# Patient Record
Sex: Male | Born: 1960 | Race: White | Hispanic: No | Smoking: Former smoker
Health system: Southern US, Community
[De-identification: ages and names within clinical notes are randomized; demographics above are authoritative.]

## PROBLEM LIST (undated history)

## (undated) DIAGNOSIS — I10 Essential (primary) hypertension: Secondary | ICD-10-CM

## (undated) DIAGNOSIS — E119 Type 2 diabetes mellitus without complications: Secondary | ICD-10-CM

## (undated) DIAGNOSIS — N200 Calculus of kidney: Secondary | ICD-10-CM

## (undated) DIAGNOSIS — T7840XA Allergy, unspecified, initial encounter: Secondary | ICD-10-CM

## (undated) DIAGNOSIS — M1990 Unspecified osteoarthritis, unspecified site: Secondary | ICD-10-CM

## (undated) DIAGNOSIS — H359 Unspecified retinal disorder: Secondary | ICD-10-CM

## (undated) HISTORY — DX: Calculus of kidney: N20.0

## (undated) HISTORY — DX: Allergy, unspecified, initial encounter: T78.40XA

## (undated) HISTORY — PX: OTHER SURGICAL HISTORY: SHX169

## (undated) HISTORY — DX: Essential (primary) hypertension: I10

## (undated) HISTORY — DX: Unspecified retinal disorder: H35.9

## (undated) HISTORY — DX: Unspecified osteoarthritis, unspecified site: M19.90

## (undated) HISTORY — DX: Type 2 diabetes mellitus without complications: E11.9

---

## 2004-09-16 ENCOUNTER — Ambulatory Visit: Payer: Self-pay | Admitting: Internal Medicine

## 2006-03-09 ENCOUNTER — Ambulatory Visit: Payer: Self-pay | Admitting: Internal Medicine

## 2006-11-23 ENCOUNTER — Ambulatory Visit: Payer: Self-pay | Admitting: Internal Medicine

## 2006-11-28 ENCOUNTER — Encounter: Admission: RE | Admit: 2006-11-28 | Discharge: 2006-11-28 | Payer: Self-pay | Admitting: Internal Medicine

## 2006-12-01 DIAGNOSIS — H9190 Unspecified hearing loss, unspecified ear: Secondary | ICD-10-CM | POA: Insufficient documentation

## 2006-12-01 DIAGNOSIS — I10 Essential (primary) hypertension: Secondary | ICD-10-CM

## 2007-07-25 ENCOUNTER — Telehealth: Payer: Self-pay | Admitting: Internal Medicine

## 2007-07-25 ENCOUNTER — Emergency Department (HOSPITAL_COMMUNITY): Admission: EM | Admit: 2007-07-25 | Discharge: 2007-07-25 | Payer: Self-pay | Admitting: Emergency Medicine

## 2008-09-30 ENCOUNTER — Ambulatory Visit: Payer: Self-pay | Admitting: Internal Medicine

## 2008-10-20 IMAGING — CR DG TIBIA/FIBULA 2V*R*
2 series · 2 of 2 positions shown · non-contrast
Comparison: none

CLINICAL DATA: Hit shin six months ago with pain.  

 RIGHT TIBIA FIBULA - 2 VIEW:

[view not recorded (1 of 2)]
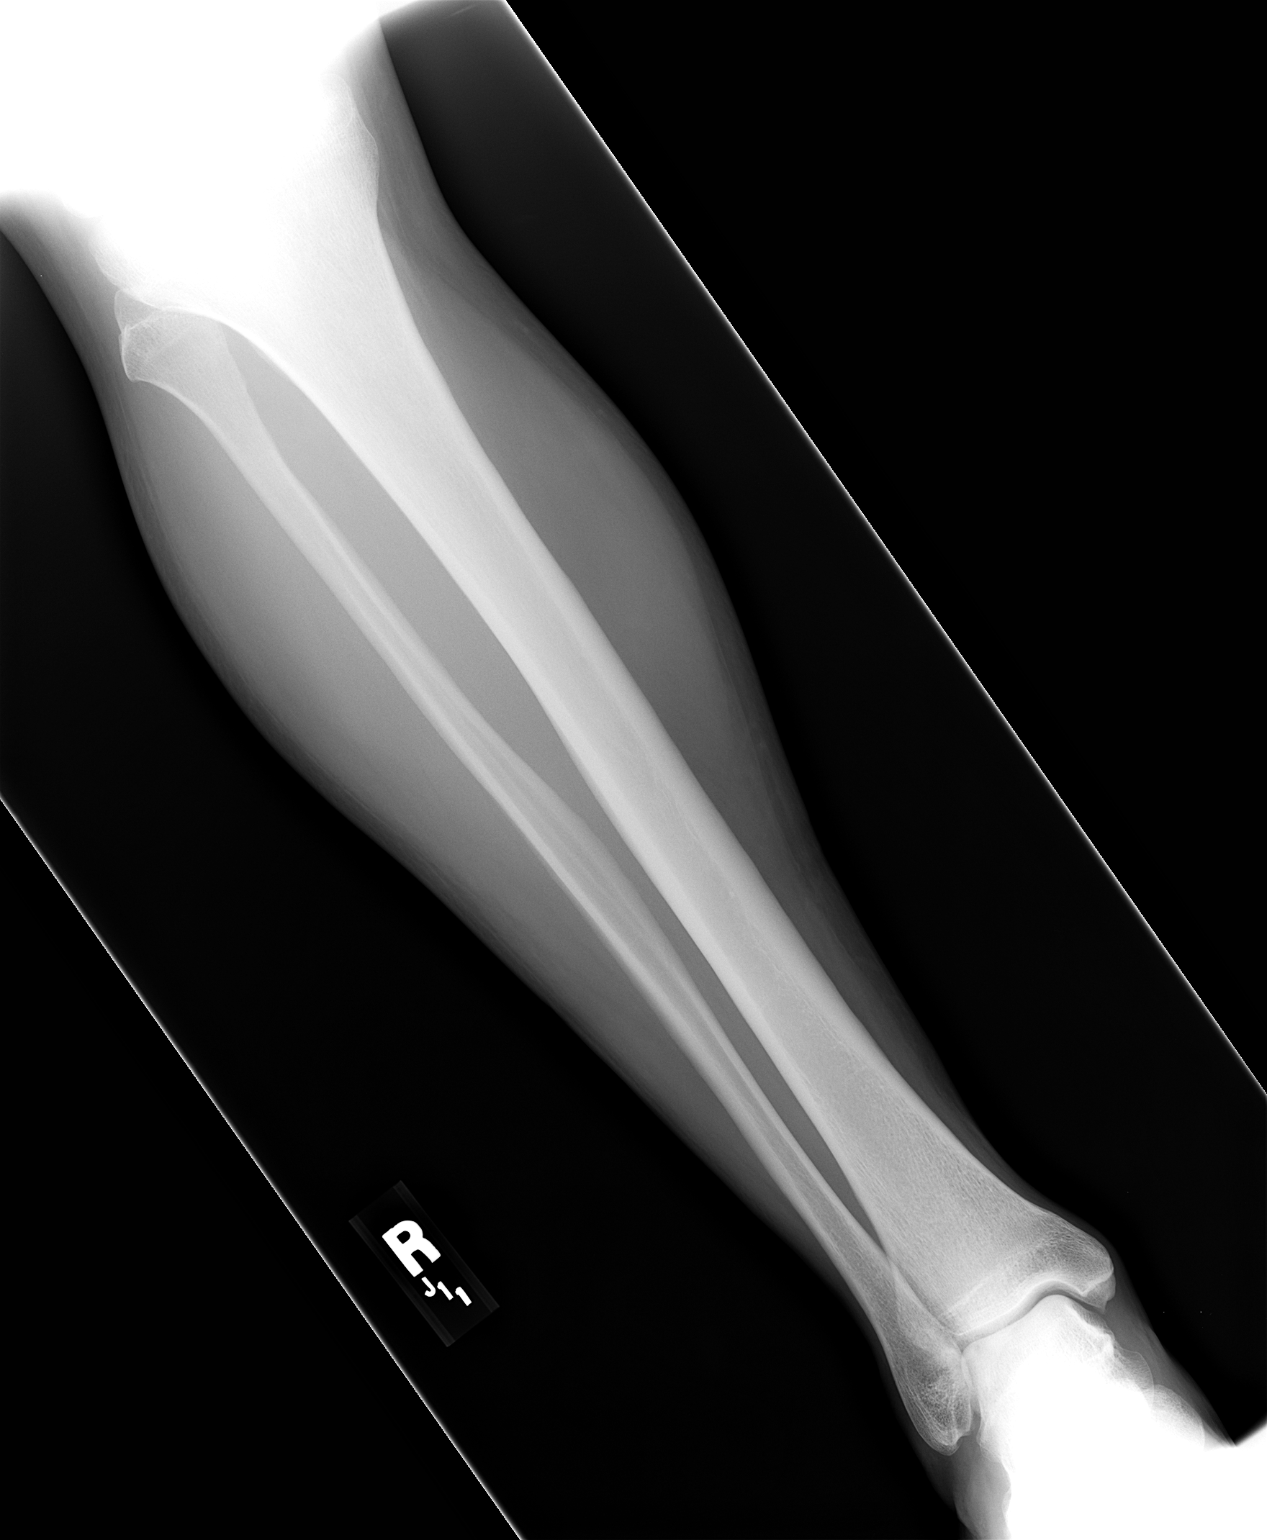

[view not recorded (2 of 2)]
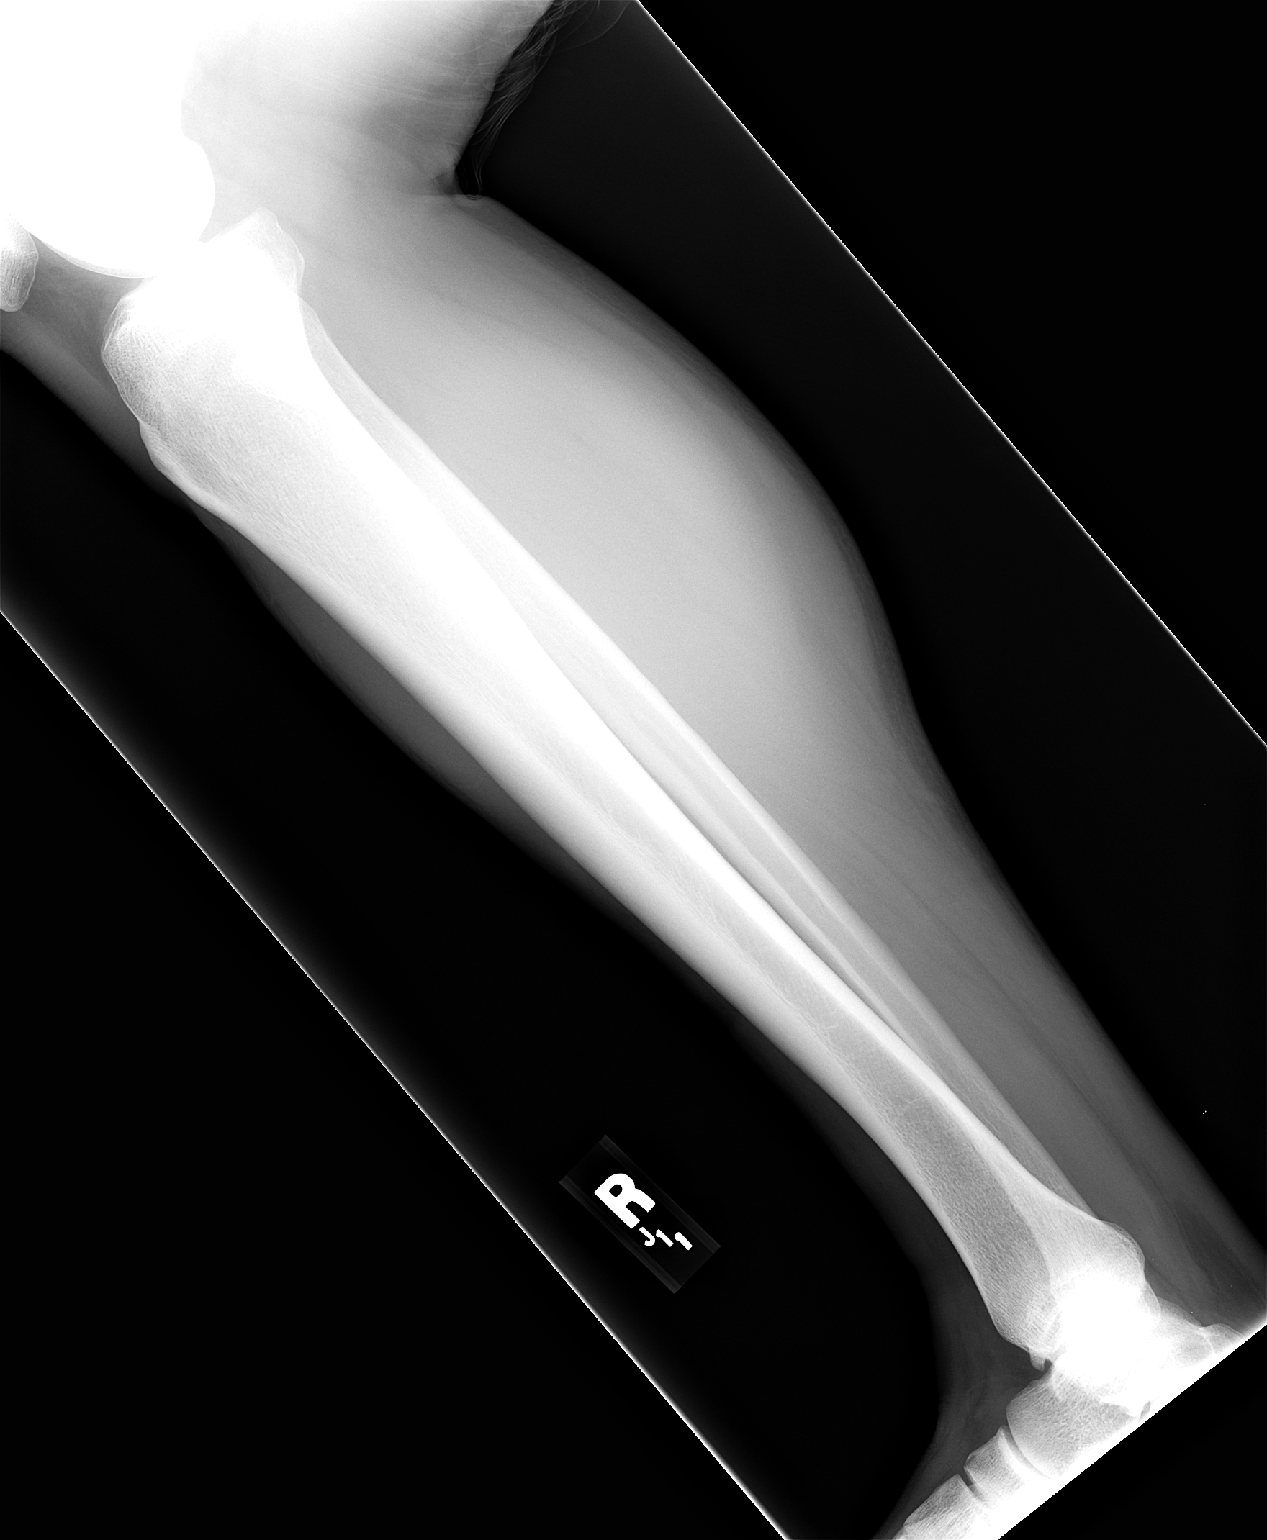

[2 of 2 positions shown; findings below may reference images not displayed]

FINDINGS: Two views of the right tibia and fibula were obtained.  No acute bony abnormality is seen.  Alignment is normal.
IMPRESSION: Negative.

## 2009-01-14 ENCOUNTER — Encounter (INDEPENDENT_AMBULATORY_CARE_PROVIDER_SITE_OTHER): Payer: Self-pay | Admitting: *Deleted

## 2009-01-14 ENCOUNTER — Ambulatory Visit: Payer: Self-pay | Admitting: Internal Medicine

## 2009-01-16 ENCOUNTER — Telehealth: Payer: Self-pay | Admitting: Internal Medicine

## 2009-07-07 ENCOUNTER — Ambulatory Visit: Payer: Self-pay | Admitting: Diagnostic Radiology

## 2009-07-07 ENCOUNTER — Ambulatory Visit (HOSPITAL_BASED_OUTPATIENT_CLINIC_OR_DEPARTMENT_OTHER): Admission: RE | Admit: 2009-07-07 | Discharge: 2009-07-07 | Payer: Self-pay | Admitting: Internal Medicine

## 2009-07-07 ENCOUNTER — Ambulatory Visit: Payer: Self-pay | Admitting: Family

## 2009-07-07 ENCOUNTER — Telehealth: Payer: Self-pay | Admitting: Family

## 2009-07-07 DIAGNOSIS — M549 Dorsalgia, unspecified: Secondary | ICD-10-CM | POA: Insufficient documentation

## 2009-07-07 DIAGNOSIS — N200 Calculus of kidney: Secondary | ICD-10-CM | POA: Insufficient documentation

## 2009-07-07 LAB — CONVERTED CEMR LAB
Glucose, Urine, Semiquant: NEGATIVE
Protein, U semiquant: NEGATIVE
Urobilinogen, UA: 0.2
WBC Urine, dipstick: NEGATIVE
pH: 6.5

## 2009-07-08 ENCOUNTER — Encounter: Payer: Self-pay | Admitting: Family

## 2009-07-09 ENCOUNTER — Telehealth: Payer: Self-pay | Admitting: Family

## 2009-07-14 ENCOUNTER — Encounter: Payer: Self-pay | Admitting: Family

## 2009-07-14 LAB — CONVERTED CEMR LAB
CO2: 26 meq/L (ref 19–32)
Calcium: 10 mg/dL (ref 8.4–10.5)
Chloride: 102 meq/L (ref 96–112)
Glucose, Bld: 93 mg/dL (ref 70–99)
Sodium: 141 meq/L (ref 135–145)

## 2009-07-15 ENCOUNTER — Encounter: Payer: Self-pay | Admitting: Family

## 2009-09-12 ENCOUNTER — Encounter: Payer: Self-pay | Admitting: Internal Medicine

## 2009-09-19 ENCOUNTER — Encounter (INDEPENDENT_AMBULATORY_CARE_PROVIDER_SITE_OTHER): Payer: Self-pay | Admitting: *Deleted

## 2009-09-23 ENCOUNTER — Telehealth (INDEPENDENT_AMBULATORY_CARE_PROVIDER_SITE_OTHER): Payer: Self-pay | Admitting: *Deleted

## 2009-09-26 ENCOUNTER — Ambulatory Visit: Payer: Self-pay | Admitting: Internal Medicine

## 2009-10-08 ENCOUNTER — Encounter: Payer: Self-pay | Admitting: Family

## 2009-10-08 ENCOUNTER — Encounter: Payer: Self-pay | Admitting: Internal Medicine

## 2009-10-10 ENCOUNTER — Encounter: Payer: Self-pay | Admitting: Internal Medicine

## 2009-10-14 ENCOUNTER — Ambulatory Visit (HOSPITAL_BASED_OUTPATIENT_CLINIC_OR_DEPARTMENT_OTHER): Admission: RE | Admit: 2009-10-14 | Discharge: 2009-10-14 | Payer: Self-pay | Admitting: Urology

## 2009-10-21 ENCOUNTER — Encounter: Payer: Self-pay | Admitting: Family

## 2009-10-21 ENCOUNTER — Telehealth (INDEPENDENT_AMBULATORY_CARE_PROVIDER_SITE_OTHER): Payer: Self-pay | Admitting: *Deleted

## 2009-10-28 ENCOUNTER — Ambulatory Visit: Payer: Self-pay | Admitting: Internal Medicine

## 2009-11-12 ENCOUNTER — Ambulatory Visit: Payer: Self-pay | Admitting: Family Medicine

## 2009-11-17 ENCOUNTER — Telehealth: Payer: Self-pay | Admitting: Internal Medicine

## 2009-11-17 ENCOUNTER — Telehealth (INDEPENDENT_AMBULATORY_CARE_PROVIDER_SITE_OTHER): Payer: Self-pay | Admitting: *Deleted

## 2009-11-28 ENCOUNTER — Encounter: Payer: Self-pay | Admitting: Internal Medicine

## 2009-12-09 ENCOUNTER — Ambulatory Visit: Payer: Self-pay | Admitting: Internal Medicine

## 2010-01-15 ENCOUNTER — Encounter (INDEPENDENT_AMBULATORY_CARE_PROVIDER_SITE_OTHER): Payer: Self-pay | Admitting: *Deleted

## 2010-02-03 ENCOUNTER — Ambulatory Visit: Payer: Self-pay | Admitting: Internal Medicine

## 2010-02-06 LAB — CONVERTED CEMR LAB
Basophils Absolute: 0 10*3/uL (ref 0.0–0.1)
Basophils Relative: 0.1 % (ref 0.0–3.0)
CO2: 27 meq/L (ref 19–32)
Chloride: 104 meq/L (ref 96–112)
Eosinophils Absolute: 0 10*3/uL (ref 0.0–0.7)
Glucose, Bld: 86 mg/dL (ref 70–99)
HCT: 42.1 % (ref 39.0–52.0)
Hemoglobin: 14.8 g/dL (ref 13.0–17.0)
Lymphocytes Relative: 61 % — ABNORMAL HIGH (ref 12.0–46.0)
Lymphs Abs: 2.3 10*3/uL (ref 0.7–4.0)
MCHC: 35.2 g/dL (ref 30.0–36.0)
MCV: 88.7 fL (ref 78.0–100.0)
Monocytes Absolute: 0.5 10*3/uL (ref 0.1–1.0)
Neutro Abs: 0.9 10*3/uL — ABNORMAL LOW (ref 1.4–7.7)
Potassium: 4.3 meq/L (ref 3.5–5.1)
RBC: 4.75 M/uL (ref 4.22–5.81)
RDW: 13.8 % (ref 11.5–14.6)
Sodium: 140 meq/L (ref 135–145)

## 2010-08-10 ENCOUNTER — Ambulatory Visit
Admission: RE | Admit: 2010-08-10 | Discharge: 2010-08-10 | Payer: Self-pay | Source: Home / Self Care | Attending: Internal Medicine | Admitting: Internal Medicine

## 2010-08-10 ENCOUNTER — Encounter: Payer: Self-pay | Admitting: Internal Medicine

## 2010-08-10 DIAGNOSIS — S70929A Unspecified superficial injury of unspecified thigh, initial encounter: Secondary | ICD-10-CM

## 2010-08-10 DIAGNOSIS — S70919A Unspecified superficial injury of unspecified hip, initial encounter: Secondary | ICD-10-CM | POA: Insufficient documentation

## 2010-08-10 DIAGNOSIS — S90919A Unspecified superficial injury of unspecified ankle, initial encounter: Secondary | ICD-10-CM

## 2010-08-10 DIAGNOSIS — S80929A Unspecified superficial injury of unspecified lower leg, initial encounter: Secondary | ICD-10-CM

## 2010-08-11 ENCOUNTER — Telehealth: Payer: Self-pay | Admitting: Internal Medicine

## 2010-08-17 ENCOUNTER — Ambulatory Visit: Admit: 2010-08-17 | Payer: Self-pay | Admitting: Internal Medicine

## 2010-08-23 LAB — CONVERTED CEMR LAB
Alkaline Phosphatase: 75 units/L
CO2, serum: 33 mmol/L
Calcium, Ion: 9.5 mg/dL
Chloride, Serum: 101 mmol/L
Creatinine, Ser: 1 mg/dL
Glucose, Bld: 86 mg/dL
Total Protein: 7.1 g/dL

## 2010-08-25 NOTE — Letter (Signed)
Summary: [facility]   Imported By: Lanelle Bal 09/29/2009 10:46:10  _____________________________________________________________________  External Attachment:    Type:   Image     Comment:   External Document

## 2010-08-25 NOTE — Progress Notes (Signed)
Summary: needs bp check  Phone Note Outgoing Call   Summary of Call: recently started on BP medicine, needs a BP check Jose E. Paz MD  October 21, 2009 9:25 AM   Cody Fox - pls call & schedule him a nurse BP check Shary Decamp  October 21, 2009 2:13 PM     Additional Follow-up for Phone Call Additional follow up Details #2::    Patient will call back eo schedule an appt Follow-up by: Barb Merino,  October 21, 2009 2:16 PM

## 2010-08-25 NOTE — Assessment & Plan Note (Signed)
Summary: BP VISIT PER [facility]////SPH  Nurse Visit   Vital Signs:  Patient profile:   50 year old male Height:      69 inches Weight:      228 pounds BMI:     33.79 Pulse rate:   78 / minute BP sitting:   150 / 100  Vitals Entered By: Shary Decamp (November 12, 2009 11:31 AM) CC: bp check Comments Patient states he feels ok.  Had 2 cups of coffee before ov today.  Advised to check bp over next 5 days & call me monday with readings. Shary Decamp  November 12, 2009 11:32 AM    Impression & Recommendations:  Problem # 1:  HYPERTENSION (ICD-401.9) not at goal  plan : add cozaar 50mg  1 once daily nurse visit for BP check and BMP in 3 weeks  Reshawn Ostlund E. Jyla Hopf MD  November 17, 2009 2:10 PM    His updated medication list for this problem includes:    Metoprolol Succinate 50 Mg Xr24h-tab (Metoprolol succinate) .Marland Kitchen... 2  by mouth daily    Losartan Potassium 50 Mg Tabs (Losartan potassium) .Marland Kitchen... 1 daily - due bp check in 3 weeks  Orders: No Charge Patient Arrived (NCPA0) (NCPA0)  Complete Medication List: 1)  Metoprolol Succinate 50 Mg Xr24h-tab (Metoprolol succinate) .... 2  by mouth daily 2)  Losartan Potassium 50 Mg Tabs (Losartan potassium) .Marland Kitchen.. 1 daily - due bp check in 3 weeks   Current Medications (verified): 1)  Metoprolol Succinate 50 Mg Xr24h-Tab (Metoprolol Succinate) .... 2  By Mouth Daily  Allergies (verified): No Known Drug Allergies  Orders Added: 1)  No Charge Patient Arrived (NCPA0) [NCPA0] Prescriptions: LOSARTAN POTASSIUM 50 MG TABS (LOSARTAN POTASSIUM) 1 daily - DUE BP CHECK IN 3 WEEKS  #30 x 1   Entered by:   Shary Decamp   Authorized by:   Nolon Rod. Harmonii Karle MD   Signed by:   Shary Decamp on 11/17/2009   Method used:   [facility]        [facility] [phone][facility] (retail)       [address]       [place], [place]  [address]       Ph: [phone]       Fax: [phone]   RxID:   [id number] METOPROLOL SUCCINATE 50 MG XR24H-TAB  (METOPROLOL SUCCINATE) 2  by mouth daily  #60 x 0   Entered by:   Shary Decamp   Authorized by:   Nolon Rod. Saladin Petrelli MD   Signed by:   Shary Decamp on 11/12/2009   Method used:   [facility]ically to        [facility] [phone]* (retail)       [address]       [place], [place]  [address]       Ph: [phone]       Fax: [phone]   RxID:   [id number]

## 2010-08-25 NOTE — Progress Notes (Signed)
Summary: needs ov  Phone Note Outgoing Call   Summary of Call: see eye exam report, arrange a  office visit within the next few days Jose E. Paz MD  September 23, 2009 2:57 PM   Enrique Sack -- needs ov, needs to discuss note that we received from opth [facility]  September 23, 2009 4:40 PM     Additional Follow-up for Phone Call Additional follow up Details #2::    LMTCB Follow-up by: Barb Merino,  September 24, 2009 8:43 AM

## 2010-08-25 NOTE — Letter (Signed)
Summary: [facility]  [facility]   Imported By: Lanelle Bal 11/03/2009 12:55:03  _____________________________________________________________________  External Attachment:    Type:   Image     Comment:   External Document

## 2010-08-25 NOTE — Assessment & Plan Note (Signed)
Summary: FOLLOWUP ///SPH   Vital Signs:  Patient profile:   50 year old male Height:      69 inches Weight:      228.4 pounds BMI:     33.85 Pulse rate:   80 / minute BP sitting:   150 / 96  Vitals Entered By: Shary Decamp (October 28, 2009 3:44 PM) CC: rov - bp   History of Present Illness: f/u HTN no s/e from meds   Current Medications (verified): 1)  Metoprolol Succinate 50 Mg Xr24h-Tab (Metoprolol Succinate) .... One By Mouth Daily  Allergies (verified): No Known Drug Allergies  Past History:  Past Medical History: Reviewed history from 12/01/2006 and no changes required. Hypertension  Past Surgical History: Reviewed history from 09/30/2008 and no changes required. no major  Review of Systems General:  denies dizzines, fatigue  used to have HAs which have decreased  since he started his med .  Physical Exam  General:  alert and well-developed.   Lungs:  normal respiratory effort, no intercostal retractions, no accessory muscle use, and normal breath sounds.   Heart:  normal rate, regular rhythm, and no murmur.     Impression & Recommendations:  Problem # 1:  HYPERTENSION (ICD-401.9) not at goal  increase metoprolol nurse visit 2 week, no charge  His updated medication list for this problem includes:    Metoprolol Succinate 50 Mg Xr24h-tab (Metoprolol succinate) .Marland Kitchen... 2  by mouth daily  Complete Medication List: 1)  Metoprolol Succinate 50 Mg Xr24h-tab (Metoprolol succinate) .... 2  by mouth daily  Patient Instructions: 1)  take 2 metoprolol daily (together) 2)  nurse visit in 2 weeks for BP check Prescriptions: METOPROLOL SUCCINATE 50 MG XR24H-TAB (METOPROLOL SUCCINATE) 2  by mouth daily  #60 x 6   Entered and Authorized by:   Elita Quick E. Cyndie Woodbeck MD   Signed by:   Nolon Rod. Domenico Achord MD on 10/28/2009   Method used:   Print then Give to Patient   RxID:   [id number]

## 2010-08-25 NOTE — Miscellaneous (Signed)
Summary: labs from [facility]  Clinical Lists Changes  Observations: Added new observation of PROTEIN, TOT: 7.1 g/dL (65/78/4696 29:52) Added new observation of ALBUMIN: 4.6 g/dL (84/13/2440 10:27) Added new observation of BILI TOTAL: 0.7 mg/dL (25/36/6440 34:74) Added new observation of ALK PHOS: 75 units/L (10/08/2009 10:11) Added new observation of SGPT (ALT): 61 units/L (10/08/2009 10:11) Added new observation of SGOT (AST): 49 units/L (10/08/2009 10:11) Added new observation of CALCIUM ION.: 9.5 mg/dL (25/95/6387 56:43) Added new observation of BG RANDOM: 86 mg/dL (32/95/1884 16:60) Added new observation of CREATININE: 1.0 mg/dL (63/07/6008 93:23) Added new observation of BUN: 15 mg/dL (55/73/2202 54:27) Added new observation of CO2 TOTAL: 33 mmol/L (10/08/2009 10:11) Added new observation of CHLORIDE: 101 mmol/L (10/08/2009 10:11) Added new observation of POTASSIUM: 4.0 mmol/L (10/08/2009 10:11) Added new observation of SODIUM: 137 mmol/L (10/08/2009 10:11)      Chemistry Labs Test Date: 10/08/2009                      Value Units        H/L   Reference  Sodium:             137   mmol/L             (137-145) Potassium:          4.0   mmol/L             (3.6-5.0) Chloride:           101   mmol/L             (101-111) CO2:                33    mmol/L        H    (22-31) BUN:                15    mg/dL              (0-62) Creatinine:         1.0   mg/dL              (3.7-6.2) Glucose-random:     86    mg/dL              (83-151) Calcium (ionized):  9.5   mg/dL         H    (2-4) SGOT:               49    U/L           H    (10-40) SGPT:               61    U/L           H    (10-40) Alkaline P'tase:    75    U/L                (10-120) T. Bili:            0.7   mg/dL              (7.6-1.6) Albumin:            4.6   g/dL               (3-5) Total Protein:      7.1   g/dL          H    (4-7)  Test ordered  by:    [facility]

## 2010-08-25 NOTE — Letter (Signed)
Summary: HTN retinopathy---[facility]   Imported By: Lanelle Bal 10/21/2009 11:54:54  _____________________________________________________________________  External Attachment:    Type:   Image     Comment:   External Document

## 2010-08-25 NOTE — Miscellaneous (Signed)
  Clinical Lists Changes  Observations: Added new observation of DIAB EYE EX: Location: [facility] Results:Abnormal. 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

## 2010-08-25 NOTE — Assessment & Plan Note (Signed)
Summary: rto /cbs   Vital Signs:  Patient profile:   50 year old male Weight:      225 pounds Pulse rate:   86 / minute Pulse rhythm:   regular BP sitting:   144 / 88  (left arm) Cuff size:   large  Vitals Entered By: Army Fossa CMA (February 03, 2010 2:47 PM) CC: Pt here for follow up visit on BP.  Comments States he is no longer having HA's.   History of Present Illness: was seen in April 2011 with elevated BP Since then we have added losartan He is tolerating well He is ambulatory BPs range from 96-110/80 When his systolic BP is in the 90s, he is asymptomatic  Allergies (verified): No Known Drug Allergies  Past History:  Past Medical History: Hypertension kidney stones   Review of Systems       denies chest pain,  shortness of breath no cough Denies feeling dizzy when he stands up  no fatigue  Physical Exam  General:  alert and well-developed.   Lungs:  normal respiratory effort, no intercostal retractions, no accessory muscle use, and normal breath sounds.   Heart:  normal rate, regular rhythm, and no murmur.   Extremities:  no lower extremity edema   Impression & Recommendations:  Problem # 1:  HYPERTENSION (ICD-401.9) responding very well to the medication Labs His updated medication list for this problem includes:    Metoprolol Succinate 50 Mg Xr24h-tab (Metoprolol succinate) .Marland Kitchen... 2  by mouth daily    Losartan Potassium 50 Mg Tabs (Losartan potassium) .Marland Kitchen... 1 by mouth once daily  Orders: Venipuncture ([address]) TLB-BMP (Basic Metabolic Panel-BMET) (80048-METABOL) TLB-CBC Platelet - w/Differential (85025-CBCD)  BP today: 144/88 Prior BP: 150/100 (11/12/2009)  Labs Reviewed: K+: 4.0 (10/08/2009) Creat: : 1.0 (10/08/2009)     Problem # 2:  due for a physical, patient aware, see instructions  Complete Medication List: 1)  Metoprolol Succinate 50 Mg Xr24h-tab (Metoprolol succinate) .... 2  by mouth daily 2)  Losartan Potassium 50 Mg Tabs  (Losartan potassium) .Marland Kitchen.. 1 by mouth once daily  Patient Instructions: 1)  Please schedule a follow-up appointment in 6 months-- fasting , physical 2)  Check your blood pressure 2 or 3 times a week. If it is more than 140/85 consistently,please let us know  Prescriptions: LOSARTAN POTASSIUM 50 MG TABS (LOSARTAN POTASSIUM) 1 by mouth once daily  #90 x 3   Entered and Authorized by:   Elita Quick E. Jordie Schreur MD   Signed by:   Nolon Rod. Saben Donigan MD on 02/03/2010   Method used:   [facility]ron[facility] to        [facility] [phone]* (retail)       [address]       [place], [place]  [address]       Ph: [phone]       Fax: [phone]   RxID:   [id number] METOPROLOL SUCCINATE 50 MG XR24H-TAB (METOPROLOL SUCCINATE) 2  by mouth daily  #180 x 3   Entered and Authorized by:   Nolon Rod. Olubunmi Rothenberger MD   Signed by:   Nolon Rod. Avonda Toso MD on 02/03/2010   Method used:   Electronically to        [facility] [phone]* (retail)       [address]       [place], [place]  [address]  Ph: [phone]       Fax: [phone]   RxID:   [id number]

## 2010-08-25 NOTE — Letter (Signed)
Summary: [facility] followup  [facility]   Imported By: Lanelle Bal 12/08/2009 08:17:28  _____________________________________________________________________  External Attachment:    Type:   Image     Comment:   External Document

## 2010-08-25 NOTE — Letter (Signed)
Summary: Primary Care Appointment Letter  [facility] [place]  [address] [place], [place] [address]   Phone: [phone]  Fax: [phone]    01/15/2010 MRN: [id number]  Cody Fox [address] [place], [place]  [address]  Dear [name],   Your Primary Care Physician Nolon Rod. Paz MD has indicated that:    __X_____it is time to schedule an appointment.    _______you missed your appointment on______ and need to call and          reschedule.    _______you need to have lab work done.    _______you need to schedule an appointment discuss lab or test results.    _______you need to call to reschedule your appointment that is                       scheduled on _________.     Please call our office as soon as possible. Our phone number is [phone]. Please press option 1. Our office is open 8a-12noon and 1p-5p, Monday through Friday.     Thank you,    [facility]

## 2010-08-25 NOTE — Progress Notes (Signed)
Summary: due bp check in 3 weeks  Phone Note Outgoing Call Call back at Work Phone [phone]   Summary of Call: DUE NURSE VISIT IN 3 WEEKS BP, BMP DX 401.1 Shary Decamp  November 17, 2009 2:29 PM   Follow-up for Phone Call        Patient is coming in 5.17.11 Follow-up by: Harold Barban,  November 18, 2009 8:49 AM

## 2010-08-25 NOTE — Assessment & Plan Note (Signed)
Summary: ELEVATED BP/CDJ   Vital Signs:  Patient profile:   50 year old male Height:      69 inches Weight:      228.4 pounds BMI:     33.85 Pulse rate:   84 / minute BP sitting:   160 / 100  Vitals Entered By: Shary Decamp (September 26, 2009 4:07 PM) CC: f/u from opth   History of Present Illness: he was seen at the optpmetrist  and was found to have evidence of high blood pressure and his eyes he feels well  Allergies: No Known Drug Allergies  Past History:  Past Medical History: Reviewed history from 12/01/2006 and no changes required. Hypertension  Past Surgical History: Reviewed history from 09/30/2008 and no changes required. no major  Review of Systems       denies chest pain, headaches, shortness of breath no lower extremity edema he recently had kidney stones, he has seen urology, apparently he has not passed the stone, he does not have severe symptoms except for occasional backache  Physical Exam  General:  alert and well-developed.   Lungs:  normal respiratory effort, no intercostal retractions, no accessory muscle use, and normal breath sounds.   Heart:  normal rate, regular rhythm, and no murmur.   Abdomen:  soft, non-tender, no distention, and no masses.  no bruit Extremities:  no lower extremity edema   Impression & Recommendations:  Problem # 1:  HYPERTENSION (ICD-401.9) the patient has a history of hypertension, recently seen by an optometrist and had evidence of hypertensive retinopathy he has never taken any medications for BP his BP at this office  has been in the normal or borderline elevated levels  he reports that he follows a low salt diet already. he exercises sometimes Plan: Start metoprolol recommend to follow up with urology to be sure that he does not have more problems with his urolithiasis recheck BMP on return to the office nurse visit in 10 days, no charge  His updated medication list for this problem includes:    Metoprolol  Succinate 50 Mg Xr24h-tab (Metoprolol succinate) ..... One by mouth daily  Complete Medication List: 1)  Polyethylene Glycol 8000 Powd (Polyethylene glycol 8000) .... Prn 2)  Vicodin 5-500 Mg Tabs (Hydrocodone-acetaminophen) .... One tablet by mouth every 4 hours as needed 3)  Metoprolol Succinate 50 Mg Xr24h-tab (Metoprolol succinate) .... One by mouth daily  Patient Instructions: 1)  start taking metoprolol  one a day 2)  nurse visit in 10 days for a BP check 3)  come back in 6 weeks to see me Prescriptions: METOPROLOL SUCCINATE 50 MG XR24H-TAB (METOPROLOL SUCCINATE) one by mouth daily  #30 x 3   Entered and Authorized by:   Elita Quick E. Hurschel Paynter MD   Signed by:   Nolon Rod. Moishy Laday MD on 09/26/2009   Method used:   Electronically to        [facility] [phone]* (retail)       [address]       [place], [place]  [address]       Ph: [phone]       Fax: [phone]   RxID:   [id number]

## 2010-08-25 NOTE — Letter (Signed)
Summary: [facility]  [facility]   Imported By: Lanelle Bal 10/21/2009 10:43:28  _____________________________________________________________________  External Attachment:    Type:   Image     Comment:   External Document

## 2010-08-25 NOTE — Consult Note (Signed)
Summary: [facility]  [facility]   Imported By: Lanelle Bal 08/07/2009 10:46:46  _____________________________________________________________________  External Attachment:    Type:   Image     Comment:   External Document

## 2010-08-25 NOTE — Progress Notes (Signed)
Summary: bp readings  Phone Note Call from Patient Call back at [facility] Phone [phone]   Caller: Patient Summary of Call: pt called left msg BP today is 138/83 --Seen 11/12/09 was told to call today with BP --On metoprololl 50 mg 2 a day .Kandice Hams  November 17, 2009 2:14 PM SEE PREVIOUS BP CHECK: pt was started on cozaar today & advised to have bmp, bp in 3 weeks Shary Decamp  November 17, 2009 2:29 PM

## 2010-08-27 NOTE — Progress Notes (Signed)
Summary: pt status  Phone Note Outgoing Call   Call placed by: Army Fossa CMA,  August 11, 2010 12:00 PM Summary of Call: I spoke w/ pt he states no ligament damage. Just a muscle tear. He will f/u with them in 4-5 weeks. Army Fossa CMA  August 11, 2010 12:00 PM

## 2010-08-27 NOTE — Assessment & Plan Note (Signed)
Summary: pulled muscle in left calf/having trouble with foot now//kn   Vital Signs:  Patient profile:   50 year old male Height:      69 inches Weight:      222 pounds BMI:     32.90 Pulse rate:   98 / minute Pulse rhythm:   regular BP sitting:   136 / 88  (left arm) Cuff size:   large  Vitals Entered By: Army Fossa CMA (August 10, 2010 11:22 AM) CC: Felt (R) Foot over extend.  Comments Can push forward but cannot pull back taking aleve [facility]end [facility] [address] [facility]   History of Present Illness: 2 days ago, was pushing a tractor on a ramp when suddenly he felt a pop and immediate-intense pain in the left calf. Shortly after, the area swell up. Today, the pain is slightly decreased, and the swelling is about the same   Review of systems No lower extremity paresthesias or numbness No fever   Current Medications (verified): 1)  Metoprolol Succinate 50 Mg Xr24h-Tab (Metoprolol Succinate) .... 2  By Mouth Daily 2)  Losartan Potassium 50 Mg Tabs (Losartan Potassium) .Marland Kitchen.. 1 By Mouth Once Daily  Allergies (verified): No Known Drug Allergies  Past History:  Past Medical History: Reviewed history from 02/03/2010 and no changes required. Hypertension kidney stones   Past Surgical History: Reviewed history from 09/30/2008 and no changes required. no major  Social History: Reviewed history from 09/30/2008 and no changes required. 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   Pulses:  bilateral pedal pulse present, good capillary refill throughout Extremities:  right leg normal Left leg: Calf three quarters of an inch larger, quite tender to touch, it  feels tense. foot is in extension, very painful to flexion ( either passive or active)   Impression & Recommendations:  Problem # 1:  INJURY, LEG (ICD-916.8) suspect muscular or tendon injury I'm concerned about the amount of swelling in the  calf Needs  to see orthopedic surgery today   Orders: Orthopedic Referral (Ortho)  Complete Medication List: 1)  Metoprolol Succinate 50 Mg Xr24h-tab (Metoprolol succinate) .... 2  by mouth daily 2)  Losartan Potassium 50 Mg Tabs (Losartan potassium) .Marland Kitchen.. 1 by mouth once daily   Orders Added: 1)  Orthopedic Referral [Ortho] 2)  Est. Patient Level III [96295]

## 2010-09-10 NOTE — Consult Note (Signed)
Summary: [facility]  [facility]   Imported By: Lanelle Bal 08/25/2010 08:52:18  _____________________________________________________________________  External Attachment:    Type:   Image     Comment:   External Document

## 2010-09-25 ENCOUNTER — Encounter: Payer: Self-pay | Admitting: Internal Medicine

## 2010-09-25 ENCOUNTER — Encounter (INDEPENDENT_AMBULATORY_CARE_PROVIDER_SITE_OTHER): Payer: PRIVATE HEALTH INSURANCE | Admitting: Internal Medicine

## 2010-09-25 DIAGNOSIS — Z0000 Encounter for general adult medical examination without abnormal findings: Secondary | ICD-10-CM

## 2010-10-01 ENCOUNTER — Other Ambulatory Visit (INDEPENDENT_AMBULATORY_CARE_PROVIDER_SITE_OTHER): Payer: PRIVATE HEALTH INSURANCE

## 2010-10-01 ENCOUNTER — Other Ambulatory Visit: Payer: Self-pay | Admitting: Internal Medicine

## 2010-10-01 ENCOUNTER — Encounter (INDEPENDENT_AMBULATORY_CARE_PROVIDER_SITE_OTHER): Payer: Self-pay | Admitting: *Deleted

## 2010-10-01 DIAGNOSIS — Z0000 Encounter for general adult medical examination without abnormal findings: Secondary | ICD-10-CM

## 2010-10-01 DIAGNOSIS — E785 Hyperlipidemia, unspecified: Secondary | ICD-10-CM

## 2010-10-01 LAB — BASIC METABOLIC PANEL
Calcium: 9.3 mg/dL (ref 8.4–10.5)
Chloride: 102 mEq/L (ref 96–112)
Creatinine, Ser: 0.8 mg/dL (ref 0.4–1.5)
Sodium: 139 mEq/L (ref 135–145)

## 2010-10-01 LAB — CBC WITH DIFFERENTIAL/PLATELET
Basophils Absolute: 0 10*3/uL (ref 0.0–0.1)
Basophils Relative: 0.4 % (ref 0.0–3.0)
Eosinophils Absolute: 0.1 10*3/uL (ref 0.0–0.7)
Eosinophils Relative: 1.4 % (ref 0.0–5.0)
HCT: 42.1 % (ref 39.0–52.0)
Hemoglobin: 14.6 g/dL (ref 13.0–17.0)
Lymphocytes Relative: 24 % (ref 12.0–46.0)
Lymphs Abs: 1.8 10*3/uL (ref 0.7–4.0)
MCHC: 34.8 g/dL (ref 30.0–36.0)
MCV: 87.8 fl (ref 78.0–100.0)
Monocytes Absolute: 0.5 10*3/uL (ref 0.1–1.0)
Monocytes Relative: 6.7 % (ref 3.0–12.0)
Neutro Abs: 5.1 10*3/uL (ref 1.4–7.7)
Neutrophils Relative %: 67.5 % (ref 43.0–77.0)
Platelets: 338 10*3/uL (ref 150.0–400.0)
RBC: 4.8 Mil/uL (ref 4.22–5.81)
RDW: 13.9 % (ref 11.5–14.6)
WBC: 7.5 10*3/uL (ref 4.5–10.5)

## 2010-10-01 LAB — LDL CHOLESTEROL, DIRECT: Direct LDL: 127.3 mg/dL

## 2010-10-01 LAB — LIPID PANEL
Cholesterol: 188 mg/dL (ref 0–200)
HDL: 32.1 mg/dL — ABNORMAL LOW (ref >39.00)

## 2010-10-01 LAB — AST: AST: 28 U/L (ref 0–37)

## 2010-10-01 LAB — ALT: ALT: 42 U/L (ref 0–53)

## 2010-10-01 NOTE — Assessment & Plan Note (Signed)
Summary: CPX AND LABS-CDJ   Vital Signs:  Patient profile:   50 year old male Height:      69 inches Weight:      226.50 pounds Pulse rate:   80 / minute Pulse rhythm:   regular BP sitting:   128 / 80  (left arm) Cuff size:   large  Vitals Entered By: Army Fossa CMA (September 25, 2010 3:07 PM) CC: CPX, not fasitng  Comments no complaints [facility]    History of Present Illness: CPX  he was seen recently with leg injury, he saw orthopedic surgery, improving.  Review of systems No chest or shortness of breath No nausea, vomiting, diarrhea. No blood in the stools No dysuria or gross hematuria. He saw urology a few months ago on followup for kidney stones. Doing well   Preventive Screening-Counseling & Management  Caffeine-Diet-Exercise     Does Patient Exercise: yes  Current Medications (verified): 1)  Metoprolol Succinate 50 Mg Xr24h-Tab (Metoprolol Succinate) .... 2  By Mouth Daily 2)  Losartan Potassium 50 Mg Tabs (Losartan Potassium) .Marland Kitchen.. 1 By Mouth Once Daily  Allergies (verified): No Known Drug Allergies  Past History:  Past Medical History: Reviewed history from 02/03/2010 and no changes required. Hypertension kidney stones   Past Surgical History: Reviewed history from 09/30/2008 and no changes required. 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  alert and well-developed.   Neck:  no masses and no thyromegaly.  no masses and no thyromegaly.   Lungs:  normal respiratory effort, no intercostal retractions, no accessory muscle use, and normal breath sounds.  Heart:  normal rate, regular rhythm, and no murmur.   Abdomen:  soft,  non-tender, no distention, no masses, no guarding, and no rigidity.      Psych:  Cognition and judgment appear intact. Alert and cooperative with normal attention span and concentration.  not anxious appearing and not depressed appearing.  not anxious appearing and not depressed appearing.     Impression & Recommendations:  Problem # 1:  ROUTINE GENERAL MEDICAL EXAM@HEALTH  CARE FACL (ICD-V70.0)  Td 06 never had a Ccope  diet and exercise discussed EKG--nsr  Labs  Orders: EKG w/ Interpretation (93000)  Complete Medication List: 1)  Metoprolol Succinate 50 Mg Xr24h-tab (Metoprolol succinate) .... 2  by mouth daily 2)  Losartan Potassium 50 Mg Tabs (Losartan potassium) .Marland Kitchen.. 1 by mouth once daily  Patient Instructions: 1)   come back fasting 2)   CBC, BMP, AST, ALT, FLP---dx v70 3)  Please schedule a follow-up appointment in 6 months .    Orders Added: 1)  EKG w/ Interpretation [93000] 2)  Est. Patient age 29-64 [57]     Risk Factors:  Exercise:  yes

## 2010-10-19 LAB — POCT I-STAT 4, (NA,K, GLUC, HGB,HCT)
HCT: 41 % (ref 39.0–52.0)
Hemoglobin: 13.9 g/dL (ref 13.0–17.0)
Potassium: 3.6 mEq/L (ref 3.5–5.1)
Sodium: 140 mEq/L (ref 135–145)

## 2011-02-21 ENCOUNTER — Other Ambulatory Visit: Payer: Self-pay | Admitting: Internal Medicine

## 2011-04-14 ENCOUNTER — Encounter: Payer: Self-pay | Admitting: Internal Medicine

## 2011-04-16 ENCOUNTER — Ambulatory Visit: Payer: PRIVATE HEALTH INSURANCE | Admitting: Internal Medicine

## 2011-04-23 ENCOUNTER — Encounter: Payer: Self-pay | Admitting: Internal Medicine

## 2011-04-23 ENCOUNTER — Ambulatory Visit (INDEPENDENT_AMBULATORY_CARE_PROVIDER_SITE_OTHER): Payer: PRIVATE HEALTH INSURANCE | Admitting: Internal Medicine

## 2011-04-23 DIAGNOSIS — I10 Essential (primary) hypertension: Secondary | ICD-10-CM

## 2011-04-23 NOTE — Progress Notes (Signed)
  Subjective:    Patient ID: Cody Fox, male    DOB: 02/19/1961, 50 y.o.   MRN: [id number]  HPI ROV Got married 3-12,  Diet has improved, he remains active, plays soccer once a week and do weight lifting twice a week  Past Medical History  Diagnosis Date  . Hypertension   . Kidney stone     Review of Systems Medication compliance, but ambulatory blood pressures. Today's is slightly elevated but otherwise normal    Objective:   Physical Exam  Constitutional: He is oriented to person, place, and time. He appears well-developed and well-nourished.  Cardiovascular: Normal rate, regular rhythm and normal heart sounds.   No murmur heard. Pulmonary/Chest: Effort normal and breath sounds normal. No respiratory distress. He has no wheezes. He has no rales.  Musculoskeletal: He exhibits no edema.  Neurological: He is alert and oriented to person, place, and time.          Assessment & Plan:

## 2011-04-23 NOTE — Assessment & Plan Note (Signed)
Well-controlled, no change, diet and exercise discussed

## 2011-04-28 ENCOUNTER — Other Ambulatory Visit: Payer: Self-pay | Admitting: Internal Medicine

## 2011-04-28 NOTE — Telephone Encounter (Signed)
Rx Done . 

## 2011-06-12 ENCOUNTER — Telehealth: Payer: Self-pay | Admitting: Internal Medicine

## 2011-06-12 NOTE — Telephone Encounter (Signed)
Letter from optometry, pt found to have macular edema , wool spots. Please call pt: OV here after he sees the retina specialist Check BPs at home, if > 130/80 let me know

## 2011-06-14 NOTE — Telephone Encounter (Signed)
Left detailed message on personally identified voicemail to notify pt

## 2011-06-21 ENCOUNTER — Ambulatory Visit (INDEPENDENT_AMBULATORY_CARE_PROVIDER_SITE_OTHER): Payer: PRIVATE HEALTH INSURANCE | Admitting: Internal Medicine

## 2011-06-21 ENCOUNTER — Encounter: Payer: Self-pay | Admitting: Internal Medicine

## 2011-06-21 VITALS — BP 140/90 | HR 65 | Temp 98.3°F | Wt 224.0 lb

## 2011-06-21 DIAGNOSIS — I10 Essential (primary) hypertension: Secondary | ICD-10-CM

## 2011-06-21 DIAGNOSIS — H359 Unspecified retinal disorder: Secondary | ICD-10-CM | POA: Insufficient documentation

## 2011-06-21 HISTORY — DX: Unspecified retinal disorder: H35.9

## 2011-06-21 MED ORDER — LOSARTAN POTASSIUM 100 MG PO TABS: 100.0000 mg | ORAL_TABLET | Freq: Every day | ORAL | Status: DC

## 2011-06-21 NOTE — Patient Instructions (Signed)
Check the  blood pressure daily , be sure it is less than 130/80. If it is consistently higher, let me know. Came back in 2 weeks for labs, no fasting: BMP CBC --- dx HTN Sed rate, ANA, Rheumatoid factor--- dx retina disorder

## 2011-06-21 NOTE — Assessment & Plan Note (Signed)
found to have RIGHT  macular edema, letter from optometrist not available at his time (in transit to be scanned), note from retina specialist not available either but will do basic labs. Pt considering local eye injection

## 2011-06-21 NOTE — Progress Notes (Signed)
  Subjective:    Patient ID: Cody Fox, male    DOB: Feb 22, 1961, 50 y.o.   MRN: [id number]  HPI A few weeks ago noted a gradual decrease of his right visual acuity, saw an optometrist and eventually a specialist, no records available but he was recommended to start local eye l injections  hoping to completely control the edema. Ambulatory blood pressure varies from 125, 150/78, 92. Good medication compliance  Past Medical History  Diagnosis Date  . Hypertension   . Kidney stone    No past surgical history on file.  Review of Systems Other than the decreased vision in the right side he is doing well, no headaches, nausea or vomiting. Vision in the right eye is quite limited except in the peripheral    Objective:   Physical Exam  Constitutional: He is oriented to person, place, and time. He appears well-developed and well-nourished.  HENT:  Head: Normocephalic and atraumatic.  Cardiovascular: Normal rate, regular rhythm and normal heart sounds.   No murmur heard. Pulmonary/Chest: Effort normal and breath sounds normal. No respiratory distress. He has no wheezes. He has no rales.  Musculoskeletal: He exhibits no edema.  Neurological: He is alert and oriented to person, place, and time.  Psychiatric: He has a normal mood and affect. His behavior is normal. Judgment and thought content normal.      Assessment & Plan:

## 2011-06-21 NOTE — Assessment & Plan Note (Signed)
Needs better control, increase losartan dose, labs in 2 weeks, see instructions

## 2011-07-21 ENCOUNTER — Other Ambulatory Visit: Payer: Self-pay | Admitting: Internal Medicine

## 2011-07-21 DIAGNOSIS — I10 Essential (primary) hypertension: Secondary | ICD-10-CM

## 2011-07-21 DIAGNOSIS — H359 Unspecified retinal disorder: Secondary | ICD-10-CM

## 2011-07-22 ENCOUNTER — Other Ambulatory Visit (INDEPENDENT_AMBULATORY_CARE_PROVIDER_SITE_OTHER): Payer: PRIVATE HEALTH INSURANCE

## 2011-07-22 DIAGNOSIS — I10 Essential (primary) hypertension: Secondary | ICD-10-CM

## 2011-07-22 DIAGNOSIS — H359 Unspecified retinal disorder: Secondary | ICD-10-CM

## 2011-07-22 LAB — BASIC METABOLIC PANEL
BUN: 22 mg/dL (ref 6–23)
Calcium: 9.1 mg/dL (ref 8.4–10.5)
Creatinine, Ser: 1.1 mg/dL (ref 0.4–1.5)
GFR: 76.66 mL/min (ref >60.00)

## 2011-07-22 LAB — SEDIMENTATION RATE: Sed Rate: 24 mm/hr — ABNORMAL HIGH (ref 0–22)

## 2011-07-22 LAB — CBC
RDW: 13.1 % (ref 11.5–14.6)
WBC: 6.7 10*3/uL (ref 4.5–10.5)

## 2011-07-22 NOTE — Progress Notes (Signed)
LABS ONLY  

## 2011-07-23 ENCOUNTER — Other Ambulatory Visit: Payer: Self-pay

## 2011-07-23 LAB — ANA: Anti Nuclear Antibody(ANA): NEGATIVE

## 2011-07-23 LAB — RHEUMATOID FACTOR: Rhuematoid fact SerPl-aCnc: <10 IU/mL (ref <=14)

## 2011-07-23 MED ORDER — METOPROLOL SUCCINATE ER 50 MG PO TB24: 50.0000 mg | ORAL_TABLET | Freq: Two times a day (BID) | ORAL | Status: DC

## 2012-01-18 ENCOUNTER — Other Ambulatory Visit: Payer: Self-pay | Admitting: Internal Medicine

## 2012-01-18 NOTE — Telephone Encounter (Signed)
Refill done.  

## 2012-02-17 ENCOUNTER — Other Ambulatory Visit: Payer: Self-pay | Admitting: Internal Medicine

## 2012-02-17 NOTE — Telephone Encounter (Signed)
Refill done.  

## 2012-06-16 ENCOUNTER — Telehealth: Payer: Self-pay | Admitting: Internal Medicine

## 2012-06-16 MED ORDER — METOPROLOL SUCCINATE ER 50 MG PO TB24: 50.0000 mg | ORAL_TABLET | Freq: Two times a day (BID) | ORAL | Status: DC

## 2012-06-16 MED ORDER — LOSARTAN POTASSIUM 100 MG PO TABS: 100.0000 mg | ORAL_TABLET | Freq: Every day | ORAL | Status: DC

## 2012-06-16 NOTE — Telephone Encounter (Signed)
Refill done. Pt must schedule OV for future refills.  

## 2012-06-16 NOTE — Telephone Encounter (Signed)
Refill: Metooprolol 100 mg er tab. 90 day supply  Losartan pot 100 mg tab. 90 day supply

## 2012-06-21 ENCOUNTER — Telehealth: Payer: Self-pay

## 2012-06-21 MED ORDER — LOSARTAN POTASSIUM 100 MG PO TABS: 100.0000 mg | ORAL_TABLET | Freq: Every day | ORAL | Status: DC

## 2012-06-21 MED ORDER — METOPROLOL SUCCINATE ER 50 MG PO TB24: 50.0000 mg | ORAL_TABLET | Freq: Two times a day (BID) | ORAL | Status: DC

## 2012-06-21 NOTE — Telephone Encounter (Signed)
Discussed with pt. Sent in rx to [facility].

## 2012-06-21 NOTE — Telephone Encounter (Signed)
he is overdue for a visit, okay 90 days, no refills. Please arrange a OV within a month

## 2012-06-21 NOTE — Telephone Encounter (Signed)
Pt LMOVM stating his pharmacy is going to change to mail order [facility] for 90 day supply on Rx. Pt requesting CB to provide other info. MW

## 2012-09-06 ENCOUNTER — Other Ambulatory Visit: Payer: Self-pay | Admitting: Internal Medicine

## 2012-09-06 NOTE — Telephone Encounter (Addendum)
Call patient,  Set a office visit within a month. Send one-month supply to a local pharmacy

## 2012-09-06 NOTE — Telephone Encounter (Signed)
Pt has not been seen within a year. OK to refill? 

## 2012-09-12 NOTE — Telephone Encounter (Signed)
Discussed with pt, scheduled CPE 3.12.14. Refill done.

## 2012-10-04 ENCOUNTER — Ambulatory Visit (INDEPENDENT_AMBULATORY_CARE_PROVIDER_SITE_OTHER): Payer: PRIVATE HEALTH INSURANCE | Admitting: Internal Medicine

## 2012-10-04 ENCOUNTER — Encounter: Payer: Self-pay | Admitting: Internal Medicine

## 2012-10-04 VITALS — BP 160/100 | HR 67 | Temp 98.2°F | Ht 69.0 in | Wt 232.0 lb

## 2012-10-04 DIAGNOSIS — Z0000 Encounter for general adult medical examination without abnormal findings: Secondary | ICD-10-CM

## 2012-10-04 DIAGNOSIS — I10 Essential (primary) hypertension: Secondary | ICD-10-CM

## 2012-10-04 DIAGNOSIS — H359 Unspecified retinal disorder: Secondary | ICD-10-CM

## 2012-10-04 LAB — CBC WITH DIFFERENTIAL/PLATELET
Basophils Absolute: 0 10*3/uL (ref 0.0–0.1)
Hemoglobin: 13.5 g/dL (ref 13.0–17.0)
Lymphocytes Relative: 27.4 % (ref 12.0–46.0)
Monocytes Relative: 7.2 % (ref 3.0–12.0)
Neutro Abs: 4.1 10*3/uL (ref 1.4–7.7)
Neutrophils Relative %: 63.8 % (ref 43.0–77.0)
RDW: 13.8 % (ref 11.5–14.6)

## 2012-10-04 LAB — COMPREHENSIVE METABOLIC PANEL
ALT: 51 U/L (ref 0–53)
BUN: 14 mg/dL (ref 6–23)
CO2: 31 mEq/L (ref 19–32)
Calcium: 9.2 mg/dL (ref 8.4–10.5)
Chloride: 102 mEq/L (ref 96–112)
Creatinine, Ser: 1.1 mg/dL (ref 0.4–1.5)
GFR: 76.27 mL/min (ref >60.00)

## 2012-10-04 LAB — TSH: TSH: 1.27 u[IU]/mL (ref 0.35–5.50)

## 2012-10-04 MED ORDER — METOPROLOL SUCCINATE ER 100 MG PO TB24: 100.0000 mg | ORAL_TABLET | Freq: Every day | ORAL | Status: DC

## 2012-10-04 MED ORDER — AMLODIPINE BESYLATE 10 MG PO TABS: 10.0000 mg | ORAL_TABLET | Freq: Every day | ORAL | Status: DC

## 2012-10-04 MED ORDER — LOSARTAN POTASSIUM 100 MG PO TABS: 100.0000 mg | ORAL_TABLET | Freq: Every day | ORAL | Status: DC

## 2012-10-04 NOTE — Assessment & Plan Note (Signed)
BP not well controlled, good compliance with medication. Had a long conversation about consequences of increased BP, including CAD, strokes etc. We'll continue with losartan Toprol. He has a history of kidney stones and also trace edema on exam. Plan: Add amlodipine, watch for edema. Strongly encourage portion control and daily exercise at least 3 hours a week.

## 2012-10-04 NOTE — Patient Instructions (Addendum)
Exercise at least 3 hours a week. Watch your diet, be careful with portion control. Check the  blood pressure 2 or 3 times a week, be sure it is between 110/60 and 140/85. If it is consistently higher or lower, let me know Continue taking losartan, metoprolol.  Also take amlodipine one tablet daily. Watch for side effects including swelling.  --- Please come back in one month for a checkup, fasting,we'll need to check your blood and blood pressure.  Hypertension As your heart beats, it forces blood through your arteries. This force is your blood pressure. If the pressure is too high, it is called hypertension (HTN) or high blood pressure. HTN is dangerous because you may have it and not know it. High blood pressure may mean that your heart has to work harder to pump blood. Your arteries may be narrow or stiff. The extra work puts you at risk for heart disease, stroke, and other problems.  Blood pressure consists of two numbers, a higher number over a lower, 110/72, for example. It is stated as "110 over 72." The ideal is below 120 for the top number (systolic) and under 80 for the bottom (diastolic). Write down your blood pressure today. You should pay close attention to your blood pressure if you have certain conditions such as:  Heart failure.  Prior heart attack.  Diabetes  Chronic kidney disease.  Prior stroke.  Multiple risk factors for heart disease. To see if you have HTN, your blood pressure should be measured while you are seated with your arm held at the level of the heart. It should be measured at least twice. A one-time elevated blood pressure reading (especially in the Emergency Department) does not mean that you need treatment. There may be conditions in which the blood pressure is different between your right and left arms. It is important to see your caregiver soon for a recheck. Most people have essential hypertension which means that there is not a specific cause. This  type of high blood pressure may be lowered by changing lifestyle factors such as:  Stress.  Smoking.  Lack of exercise.  Excessive weight.  Drug/tobacco/alcohol use.  Eating less salt. Most people do not have symptoms from high blood pressure until it has caused damage to the body. Effective treatment can often prevent, delay or reduce that damage. TREATMENT  When a cause has been identified, treatment for high blood pressure is directed at the cause. There are a large number of medications to treat HTN. These fall into several categories, and your caregiver will help you select the medicines that are best for you. Medications may have side effects. You should review side effects with your caregiver. If your blood pressure stays high after you have made lifestyle changes or started on medicines,   Your medication(s) may need to be changed.  Other problems may need to be addressed.  Be certain you understand your prescriptions, and know how and when to take your medicine.  Be sure to follow up with your caregiver within the time frame advised (usually within two weeks) to have your blood pressure rechecked and to review your medications.  If you are taking more than one medicine to lower your blood pressure, make sure you know how and at what times they should be taken. Taking two medicines at the same time can result in blood pressure that is too low. SEEK IMMEDIATE MEDICAL CARE IF:  You develop a severe headache, blurred or changing vision, or confusion.  You have unusual weakness or numbness, or a faint feeling.  You have severe chest or abdominal pain, vomiting, or breathing problems. MAKE SURE YOU:   Understand these instructions.  Will watch your condition.  Will get help right away if you are not doing well or get worse. Document Released: 07/12/2005 Document Revised: 10/04/2011 Document Reviewed: 03/01/2008 [facility] Patient Information 2013 [place], [place].

## 2012-10-04 NOTE — Assessment & Plan Note (Signed)
Td 06 never had a Ccope  diet and exercise discussed, see HTN Labs

## 2012-10-04 NOTE — Progress Notes (Signed)
  Subjective:    Patient ID: Cody Fox, male    DOB: 01/31/1961, 52 y.o.   MRN: [id number]  HPI Complete physical exam, in general feeling well. His BP is elevated today, reports that in the ambulatory setting, his BP is around 160/100 as well despite taking his medications correctly. Admits there is room for improvement on his diet and exercise.  Past Medical History  Diagnosis Date  . Hypertension   . Kidney stone   . 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 No past surgeries     History   Social History  . Marital Status: Single    Spouse Name: N/A    Number of Children: 0  . Years of Education: N/A   Occupational History  . [employer]     @ [employer]   Social History Main Topics  . Smoking status: Former Smoker    Quit date: 07/26/1993  . Smokeless tobacco: Never Used  . Alcohol Use: Yes     Comment: rarely  . Drug Use: No  . Sexually Active: Not on file   Other Topics Concern  . Not on file   Social History Narrative   Married , 4 children (wife's)   works at a Environmental health practitioner    no children of his own   exercise-- some walking, slow pase   diet-- wife's cook, healthy , problem w/ portion control   Family History  Problem Relation Age of Onset  . Hypertension Father   . Coronary artery disease Neg Hx   . Cancer Neg Hx     prostate or colon    Review of Systems  Respiratory: Negative for cough and shortness of breath.   Cardiovascular: Negative for chest pain and leg swelling.  Gastrointestinal: Negative for vomiting and blood in stool.  Genitourinary: Negative for dysuria, hematuria and difficulty urinating.  Psychiatric/Behavioral:       No depression or anxiety        Objective:   Physical Exam BP 160/100  Pulse 67  Temp(Src) 98.2 F (36.8 C) (Tympanic)  Ht 5\' 9"  (1.753 m)  Wt 232 lb (105.235 kg)  BMI 34.24 kg/m2  SpO2 94%  General -- alert, well-developed    Neck  --no thyromegaly , normal carotid pulse  Lungs -- normal respiratory effort, no intercostal retractions, no accessory muscle use, and normal breath sounds.   Heart-- normal rate, regular rhythm, no murmur, and no gallop.   Abdomen--soft, non-tender, no distention, no masses, no HSM, no guarding, and no rigidity.   Extremities-- trace  pretibial edema bilaterally  Neurologic-- alert & oriented X3 and strength normal in all extremities. Psych-- Cognition and judgment appear intact. Alert and cooperative with normal attention span and concentration.  not anxious appearing and not depressed appearing.      Assessment & Plan:

## 2012-10-04 NOTE — Assessment & Plan Note (Signed)
S/p local shots, dx eventually w/ macular degeneration.

## 2012-10-09 ENCOUNTER — Other Ambulatory Visit: Payer: Self-pay | Admitting: *Deleted

## 2012-10-09 MED ORDER — POTASSIUM CHLORIDE ER 10 MEQ PO TBCR: 10.0000 meq | EXTENDED_RELEASE_TABLET | Freq: Every day | ORAL | Status: DC

## 2013-01-29 ENCOUNTER — Other Ambulatory Visit: Payer: Self-pay | Admitting: *Deleted

## 2013-01-29 DIAGNOSIS — I10 Essential (primary) hypertension: Secondary | ICD-10-CM

## 2013-01-29 MED ORDER — AMLODIPINE BESYLATE 10 MG PO TABS: 10.0000 mg | ORAL_TABLET | Freq: Every day | ORAL | Status: DC

## 2013-01-29 MED ORDER — LOSARTAN POTASSIUM 100 MG PO TABS: 100.0000 mg | ORAL_TABLET | Freq: Every day | ORAL | Status: DC

## 2013-01-29 NOTE — Telephone Encounter (Signed)
Pt wife aware. Rx sent 

## 2013-05-31 ENCOUNTER — Other Ambulatory Visit: Payer: Self-pay

## 2013-07-08 ENCOUNTER — Other Ambulatory Visit: Payer: Self-pay | Admitting: Internal Medicine

## 2013-07-09 NOTE — Telephone Encounter (Signed)
rx refilled per protocol. DJR  

## 2013-10-28 ENCOUNTER — Other Ambulatory Visit: Payer: Self-pay | Admitting: Internal Medicine

## 2013-12-09 ENCOUNTER — Other Ambulatory Visit: Payer: Self-pay | Admitting: Internal Medicine

## 2014-01-16 ENCOUNTER — Other Ambulatory Visit: Payer: Self-pay | Admitting: Internal Medicine

## 2014-03-19 ENCOUNTER — Other Ambulatory Visit: Payer: Self-pay

## 2014-03-19 ENCOUNTER — Telehealth: Payer: Self-pay | Admitting: Internal Medicine

## 2014-03-19 MED ORDER — LOSARTAN POTASSIUM 100 MG PO TABS: ORAL_TABLET | ORAL | Status: DC

## 2014-03-19 MED ORDER — AMLODIPINE BESYLATE 10 MG PO TABS: ORAL_TABLET | ORAL | Status: DC

## 2014-03-19 NOTE — Telephone Encounter (Signed)
Pt came in requesting a 30 day refill on Losartan Potassium Tab 100 MG and on Amlodipine Besylate 10 mg tabs.  Would like them to be called in to [facility] since he is almost out of these meds.  Pt normally gets these meds through [facility] [phone] customer service) and wishes the 90 day supply go through them after this 30 day refill request.  Please advise.

## 2014-03-19 NOTE — Telephone Encounter (Signed)
This Pt will need to make an appt, he has not been seen since 09/2012, he does not have an upcoming appt scheduled but [facility], but until he makes an appt and is seen by Dr. Larose Kells I can not authorize a refill for 90 days.    Thanks!!

## 2014-03-21 ENCOUNTER — Other Ambulatory Visit: Payer: Self-pay | Admitting: Internal Medicine

## 2014-03-30 ENCOUNTER — Other Ambulatory Visit: Payer: Self-pay | Admitting: Internal Medicine

## 2014-05-16 ENCOUNTER — Other Ambulatory Visit: Payer: Self-pay

## 2014-06-14 ENCOUNTER — Encounter: Payer: Self-pay | Admitting: Internal Medicine

## 2014-06-14 ENCOUNTER — Ambulatory Visit (INDEPENDENT_AMBULATORY_CARE_PROVIDER_SITE_OTHER): Payer: PRIVATE HEALTH INSURANCE

## 2014-06-14 ENCOUNTER — Ambulatory Visit (INDEPENDENT_AMBULATORY_CARE_PROVIDER_SITE_OTHER): Payer: PRIVATE HEALTH INSURANCE | Admitting: Internal Medicine

## 2014-06-14 VITALS — BP 132/64 | HR 72 | Temp 98.0°F | Ht 68.0 in | Wt 239.2 lb

## 2014-06-14 DIAGNOSIS — Z0000 Encounter for general adult medical examination without abnormal findings: Secondary | ICD-10-CM

## 2014-06-14 DIAGNOSIS — L989 Disorder of the skin and subcutaneous tissue, unspecified: Secondary | ICD-10-CM

## 2014-06-14 DIAGNOSIS — I10 Essential (primary) hypertension: Secondary | ICD-10-CM

## 2014-06-14 DIAGNOSIS — Z23 Encounter for immunization: Secondary | ICD-10-CM

## 2014-06-14 LAB — CBC WITH DIFFERENTIAL/PLATELET
Basophils Absolute: 0 10*3/uL (ref 0.0–0.1)
Basophils Relative: 0 % (ref 0–1)
Eosinophils Absolute: 0.1 10*3/uL (ref 0.0–0.7)
Eosinophils Relative: 1 % (ref 0–5)
HCT: 40.3 % (ref 39.0–52.0)
HEMOGLOBIN: 13.9 g/dL (ref 13.0–17.0)
LYMPHS ABS: 1.7 10*3/uL (ref 0.7–4.0)
LYMPHS PCT: 24 % (ref 12–46)
MCH: 28.8 pg (ref 26.0–34.0)
MCHC: 34.5 g/dL (ref 30.0–36.0)
MCV: 83.6 fL (ref 78.0–100.0)
MONO ABS: 0.6 10*3/uL (ref 0.1–1.0)
MPV: 9 fL — ABNORMAL LOW (ref 9.4–12.4)
Monocytes Relative: 8 % (ref 3–12)
Neutro Abs: 4.8 10*3/uL (ref 1.7–7.7)
Neutrophils Relative %: 67 % (ref 43–77)
PLATELETS: 330 10*3/uL (ref 150–400)
RBC: 4.82 MIL/uL (ref 4.22–5.81)
RDW: 14.5 % (ref 11.5–15.5)
WBC: 7.1 10*3/uL (ref 4.0–10.5)

## 2014-06-14 LAB — LIPID PANEL
CHOL/HDL RATIO: 5.4 ratio
Cholesterol: 162 mg/dL (ref 0–200)
HDL: 30 mg/dL — AB (ref >39)
LDL CALC: 100 mg/dL — AB (ref 0–99)
Triglycerides: 158 mg/dL — ABNORMAL HIGH (ref <150)
VLDL: 32 mg/dL (ref 0–40)

## 2014-06-14 LAB — COMPREHENSIVE METABOLIC PANEL
ALT: 48 U/L (ref 0–53)
AST: 28 U/L (ref 0–37)
Albumin: 4.4 g/dL (ref 3.5–5.2)
Alkaline Phosphatase: 61 U/L (ref 39–117)
BUN: 18 mg/dL (ref 6–23)
CO2: 27 meq/L (ref 19–32)
Calcium: 8.8 mg/dL (ref 8.4–10.5)
Chloride: 102 mEq/L (ref 96–112)
Creat: 0.91 mg/dL (ref 0.50–1.35)
Glucose, Bld: 81 mg/dL (ref 70–99)
Potassium: 3.6 mEq/L (ref 3.5–5.3)
Sodium: 140 mEq/L (ref 135–145)
Total Bilirubin: 0.4 mg/dL (ref 0.2–1.2)
Total Protein: 6.6 g/dL (ref 6.0–8.3)

## 2014-06-14 NOTE — Progress Notes (Signed)
Subjective:    Patient ID: Cody Fox, male    DOB: 03/13/1961, 53 y.o.   MRN: [id number]  DOS:  06/14/2014 Type of visit - description : cpx Interval history: Doing well   ROS Denies chest pain or difficulty breathing No nausea, vomiting, diarrhea. No dysuria, gross hematuria difficulty urinating  Past Medical History  Diagnosis Date  . Hypertension   . Kidney stone   . 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 No past surgeries      History   Social History  . Marital Status: Single    Spouse Name: N/A    Number of Children: 0  . Years of Education: N/A   Occupational History  . [employer]     @ [employer]   Social History Main Topics  . Smoking status: Former Smoker    Quit date: 07/26/1993  . Smokeless tobacco: Never Used  . Alcohol Use: Yes     Comment: rarely  . Drug Use: No  . Sexual Activity: Not on file   Other Topics Concern  . Not on file   Social History Narrative   Married , 4 children (wife's)   no children of his own      Family History  Problem Relation Age of Onset  . Hypertension Father   . Coronary artery disease Neg Hx   . Cancer Neg Hx     prostate or colon  . Diabetes Neg Hx       Medication List       This list is accurate as of: 06/14/14 11:59 PM.  Always use your most recent med list.               amLODipine 10 MG tablet  Commonly known as:  NORVASC  Take 1 tablet daily.     losartan 100 MG tablet  Commonly known as:  COZAAR  Take 1 tablet daily.     metoprolol succinate 100 MG 24 hr tablet  Commonly known as:  TOPROL-XL  FOR INSTRUCTIONS ON THE    PROPER DOSING OF YOUR      MEDICATION REFER TO YOUR   PARTICIPANT COUNSELING FORM     potassium chloride 10 MEQ tablet  Commonly known as:  K-DUR  Take 1 tablet (10 mEq total) by mouth daily. For 10 days           Objective:   Physical Exam  Skin:      BP 132/64  mmHg  Pulse 72  Temp(Src) 98 F (36.7 C) (Oral)  Ht 5\' 8"  (1.727 m)  Wt 239 lb 4 oz (108.523 kg)  BMI 36.39 kg/m2  SpO2 97%  General -- alert, well-developed, NAD.  Neck --no thyromegaly  HEENT-- Not pale.   Lungs -- normal respiratory effort, no intercostal retractions, no accessory muscle use, and normal breath sounds.  Heart-- normal rate, regular rhythm, no murmur.  Abdomen-- Not distended, good bowel sounds,soft, non-tender. Rectal--  Normal sphincter tone. No rectal masses or tenderness. No stool  Prostate--exam is limited but palpated portions of the prostate are normal. Extremities-- trace  pretibial edema bilaterally  Neurologic--  alert & oriented X3. Speech normal, gait appropriate for age, strength symmetric and appropriate for age.  Psych-- Cognition and judgment appear intact. Cooperative with normal attention span and concentration. No anxious or depressed appearing.       Assessment & Plan:

## 2014-06-14 NOTE — Assessment & Plan Note (Signed)
Td 06 Flu shot-- today Colon cancer screening: Colonoscopy versus stool test discuss, elected colonoscopy, refer to GI Prostate cancer screening, DRE normal today, check a PSA Labs  diet and exercise discussed Has a mole at the left buttock, refer to dermatology

## 2014-06-14 NOTE — Patient Instructions (Signed)
Get your blood work before you leave   Leg elevation and consider compression stockings to prevent swelling Low salt diet Check the  blood pressure  weekly  Be sure your blood pressure is between  145/85  and 110/65.  if it is consistently higher or lower, let me know       Please come back to the office in 6 months  for a routine check up

## 2014-06-14 NOTE — Progress Notes (Signed)
Pre visit review using our clinic review tool, if applicable. No additional management support is needed unless otherwise documented below in the visit note. 

## 2014-06-14 NOTE — Assessment & Plan Note (Addendum)
Continue with amlodipine, losartan, metoprolol and potassium supplements Ambulatory BPs reportedly around 120. BP today very good. He has edema but is not worse than baseline and mostly at the end of the day and patient reports not worse after he started amlodipine.  Recommend low salt diet and consider compression stockings

## 2014-06-15 LAB — TSH: TSH: 1.484 u[IU]/mL (ref 0.350–4.500)

## 2014-06-15 LAB — PSA: PSA: 0.66 ng/mL (ref <=4.00)

## 2014-07-10 ENCOUNTER — Other Ambulatory Visit: Payer: Self-pay | Admitting: Internal Medicine

## 2014-07-21 ENCOUNTER — Other Ambulatory Visit: Payer: Self-pay | Admitting: Internal Medicine

## 2014-07-23 ENCOUNTER — Encounter: Payer: Self-pay | Admitting: Internal Medicine

## 2014-08-30 ENCOUNTER — Encounter: Payer: PRIVATE HEALTH INSURANCE | Admitting: Gastroenterology

## 2014-09-26 ENCOUNTER — Ambulatory Visit (INDEPENDENT_AMBULATORY_CARE_PROVIDER_SITE_OTHER): Payer: PRIVATE HEALTH INSURANCE | Admitting: Medical

## 2014-09-26 ENCOUNTER — Encounter: Payer: Self-pay | Admitting: Medical

## 2014-09-26 ENCOUNTER — Telehealth: Payer: Self-pay | Admitting: Medical

## 2014-09-26 VITALS — BP 140/90 | HR 78 | Temp 97.3°F | Ht 68.0 in | Wt 241.0 lb

## 2014-09-26 DIAGNOSIS — R1030 Lower abdominal pain, unspecified: Secondary | ICD-10-CM | POA: Diagnosis not present

## 2014-09-26 DIAGNOSIS — H6506 Acute serous otitis media, recurrent, bilateral: Secondary | ICD-10-CM

## 2014-09-26 DIAGNOSIS — H6690 Otitis media, unspecified, unspecified ear: Secondary | ICD-10-CM | POA: Insufficient documentation

## 2014-09-26 DIAGNOSIS — J209 Acute bronchitis, unspecified: Secondary | ICD-10-CM | POA: Insufficient documentation

## 2014-09-26 DIAGNOSIS — R109 Unspecified abdominal pain: Secondary | ICD-10-CM | POA: Insufficient documentation

## 2014-09-26 MED ORDER — AZITHROMYCIN 250 MG PO TABS: ORAL_TABLET | ORAL | Status: DC

## 2014-09-26 MED ORDER — HYDROCODONE-HOMATROPINE 5-1.5 MG/5ML PO SYRP: 5.0000 mL | ORAL_SOLUTION | Freq: Three times a day (TID) | ORAL | Status: DC | PRN

## 2014-09-26 MED ORDER — ALBUTEROL SULFATE HFA 108 (90 BASE) MCG/ACT IN AERS: 2.0000 | INHALATION_SPRAY | Freq: Four times a day (QID) | RESPIRATORY_TRACT | Status: DC | PRN

## 2014-09-26 NOTE — Assessment & Plan Note (Signed)
Lt side. Junction of upper and lower quadrant. Pain started when coughing Monday. Now severe when he coughs. Possible rectus abdominal strain /tear vs mild early hernia. Pain severe will get US of the area.   If study negative but symptoms persist or change notify us. If at any point severe constant pain not associated with cough then ED eval.

## 2014-09-26 NOTE — Patient Instructions (Addendum)
Otitis media Follow uri. Will rx azithromycin   Acute bronchitis Possible mild bronchitis post uri. Since some reported fever on Sunday and cough still present will rx azithromycin. hydromet for cough. If symptoms worsen then will get cxr.   Pain in the abdomen Lt side. Junction of upper and lower quadrant. Pain started when coughing Monday. Now severe when he coughs. Possible rectus abdominal strain /tear vs mild early hernia. Pain severe will get US of the area.   If study negative but symptoms persist or change notify us. If at any point severe constant pain not associated with cough then ED eval.    Follow up in 7 days pcp or as needed  Pt informed of the ultrasound tomorrow at 8:30 and to come in fasting past midnight.

## 2014-09-26 NOTE — Assessment & Plan Note (Signed)
Follow uri. Will rx azithromycin

## 2014-09-26 NOTE — Progress Notes (Signed)
Pre visit review using our clinic review tool, if applicable. No additional management support is needed unless otherwise documented below in the visit note. 

## 2014-09-26 NOTE — Progress Notes (Signed)
Subjective:    Patient ID: Modena Morrow, male    DOB: 06-23-1961, 54 y.o.   MRN: [id number]  HPI   Pt in stating he had had uri signs and signs and symptoms all last week. Those symptoms are better. Only residual cough. Now if he goes outside exposed to cold air will cough a little. Mild wheeze recently 2 days ago. But no hx of asthma. Pt used to smoke along time ago. Last time in 1986. Last week he had mild chills this past satureday and Sunday night.  Sunday night he coughed a lot all night and he wheezed some. Monday he was coughing and felt onset of left lower abdominal pain. Now when he coughs in left lower abdomen area will experience severe pain. Also on getting up from lying down he has pain.    Review of Systems  Constitutional: Negative for fever, chills and fatigue.       Some fever on Sunday.  HENT: Negative for congestion, ear discharge, ear pain, postnasal drip, sinus pressure, sneezing and sore throat.   Respiratory: Positive for cough and wheezing. Negative for chest tightness.        Sunday night wheezing and severe cough.  Cardiovascular: Negative for chest pain and palpitations.  Gastrointestinal: Negative for nausea, vomiting, diarrhea, constipation, blood in stool, abdominal distention, anal bleeding and rectal pain.       Lt side abdomen pain. Point tender area on exam but increaed pain severe on cough.  Neurological: Negative for dizziness, syncope, weakness, numbness and headaches.  Hematological: Negative for adenopathy. Does not bruise/bleed easily.    Past Medical History  Diagnosis Date  . Hypertension   . Kidney stone   . 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 Marital Status: Single    Spouse Name: N/A  . Number of Children: 0  . Years of Education: N/A   Occupational History  . [employer]     @ [employer]   Social History Main Topics  . Smoking status: Former Smoker    Quit date:  07/26/1993  . Smokeless tobacco: Never Used  . Alcohol Use: Yes     Comment: rarely  . Drug Use: No  . Sexual Activity: Not on file   Other Topics Concern  . Not on file   Social History Narrative   Married , 4 children (wife's)   no children of his own     Past Surgical History  Procedure Laterality Date  . No past surgeries      Family History  Problem Relation Age of Onset  . Hypertension Father   . Coronary artery disease Neg Hx   . Cancer Neg Hx     prostate or colon  . Diabetes Neg Hx     No Known Allergies  Current Outpatient Prescriptions on File Prior to Visit  Medication Sig Dispense Refill  . amLODipine (NORVASC) 10 MG tablet TAKE 1 TABLET DAILY 90 tablet 1  . losartan (COZAAR) 100 MG tablet TAKE 1 TABLET DAILY 90 tablet 1  . metoprolol succinate (TOPROL-XL) 100 MG 24 hr tablet FOR INSTRUCTIONS ON THE    PROPER DOSING OF YOUR      MEDICATION REFER TO YOUR   PARTICIPANT COUNSELING FORM 90 tablet 0  . metoprolol succinate (TOPROL-XL) 100 MG 24 hr tablet FOR INSTRUCTIONS ON THE    PROPER DOSING OF YOUR  MEDICATION REFER TO YOUR   PARTICIPANT COUNSELING FORM 90 tablet 1   No current facility-administered medications on file prior to visit.    BP 140/90 mmHg  Pulse 78  Temp(Src) 97.3 F (36.3 C) (Oral)  Ht 5\' 8"  (1.727 m)  Wt 241 lb (109.317 kg)  BMI 36.65 kg/m2  SpO2 95%      Objective:   Physical Exam   General  Mental Status - Alert. General Appearance - Well groomed. Not in acute distress.  Skin Rashes- No Rashes.  HEENT Head- Normal. Ear Auditory Canal - Left- Normal. Right - Normal.Tympanic Membrane- Left- mild . Right- moderate red. Eye Sclera/Conjunctiva- Left- Normal. Right- Normal. Nose & Sinuses Nasal Mucosa- Left-  Not boggy or Congested. Right-  Not  boggy or Congested. Mouth & Throat Lips: Upper Lip- Normal: no dryness, cracking, pallor, cyanosis, or vesicular eruption. Lower Lip-Normal: no dryness, cracking, pallor,  cyanosis or vesicular eruption. Buccal Mucosa- Bilateral- No Aphthous ulcers. Oropharynx- No Discharge or Erythema. Tonsils: Characteristics- Bilateral- No Erythema or Congestion. Size/Enlargement- Bilateral- No enlargement. Discharge- bilateral-None. . Abdomen Inspection:-Inspection Normal.  Palpation/Perucssion: Palpation and Percussion of the abdomen reveal- Non Tender, No Rebound tenderness, No rigidity(Guarding) and No Palpable abdominal masses. (but left side abdomen pain point tender area when he coughs) Liver:-Normal.  Spleen:- Normal.   .       Assessment & Plan:

## 2014-09-26 NOTE — Telephone Encounter (Signed)
Send rx to local pharmacy.

## 2014-09-26 NOTE — Assessment & Plan Note (Addendum)
Possible mild bronchitis post uri. Since some reported fever on Sunday and cough still present will rx azithromycin. hydromet for cough. If symptoms worsen then will get cxr.  Some wheezing the other night. So will make pro air available to use if wheezing reoccurs.

## 2014-09-27 ENCOUNTER — Ambulatory Visit (HOSPITAL_BASED_OUTPATIENT_CLINIC_OR_DEPARTMENT_OTHER)
Admission: RE | Admit: 2014-09-27 | Discharge: 2014-09-27 | Disposition: A | Discharge status: Home / Self Care | Payer: PRIVATE HEALTH INSURANCE | Source: Ambulatory Visit | Attending: Medical | Admitting: Medical

## 2014-09-27 DIAGNOSIS — R1030 Lower abdominal pain, unspecified: Secondary | ICD-10-CM | POA: Diagnosis not present

## 2014-09-30 ENCOUNTER — Ambulatory Visit (AMBULATORY_SURGERY_CENTER): Payer: Self-pay

## 2014-09-30 VITALS — Ht 69.0 in | Wt 239.4 lb

## 2014-09-30 DIAGNOSIS — Z1211 Encounter for screening for malignant neoplasm of colon: Secondary | ICD-10-CM

## 2014-09-30 MED ORDER — MOVIPREP 100 G PO SOLR: 1.0000 | Freq: Once | ORAL | Status: DC

## 2014-09-30 NOTE — Progress Notes (Signed)
No allergies to eggs or soy No diet/weight loss meds No home oxygen No past problems with anesthesia  Has email  Emmi instructions given for colonoscopy 

## 2014-10-01 ENCOUNTER — Telehealth: Payer: Self-pay | Admitting: Internal Medicine

## 2014-10-01 NOTE — Telephone Encounter (Signed)
Caller name: lourins from [facility] Relation to pt: Call back number: [phone] Pharmacy:  Reason for call:   Patient filled zithromax on 09/27/14. Wants to know if this was filled by mistake and if they should fill for patient??? Prescribed by edward Ref# [id number]

## 2014-10-02 NOTE — Telephone Encounter (Signed)
Did rx zpack to local pharmacy. Pt took med. Get lpn to call mail order and advice mistake.

## 2014-10-03 NOTE — Telephone Encounter (Signed)
Error corrected by A. Kandyce Rud

## 2014-10-03 NOTE — Telephone Encounter (Signed)
Informed Lourins that this was sent by mistake.

## 2014-10-11 ENCOUNTER — Encounter: Payer: Self-pay | Admitting: Gastroenterology

## 2014-10-11 ENCOUNTER — Ambulatory Visit (AMBULATORY_SURGERY_CENTER): Payer: PRIVATE HEALTH INSURANCE | Admitting: Gastroenterology

## 2014-10-11 VITALS — BP 137/92 | HR 66 | Temp 98.4°F | Resp 25 | Ht 69.0 in | Wt 239.0 lb

## 2014-10-11 DIAGNOSIS — D125 Benign neoplasm of sigmoid colon: Secondary | ICD-10-CM

## 2014-10-11 DIAGNOSIS — Z1211 Encounter for screening for malignant neoplasm of colon: Secondary | ICD-10-CM | POA: Diagnosis not present

## 2014-10-11 DIAGNOSIS — D123 Benign neoplasm of transverse colon: Secondary | ICD-10-CM | POA: Diagnosis not present

## 2014-10-11 MED ORDER — SODIUM CHLORIDE 0.9 % IV SOLN: 500.0000 mL | INTRAVENOUS | Status: DC

## 2014-10-11 NOTE — Progress Notes (Signed)
Report to PACU, RN, vss, BBS= Clear.  

## 2014-10-11 NOTE — Op Note (Signed)
[facility]  [address] [place], [id number]   COLONOSCOPY PROCEDURE REPORT  PATIENT: Cody, Fox  MR#: [id number] BIRTHDATE: Jul 06, 1961 , 53  yrs. old GENDER: male ENDOSCOPIST: Milus Banister, MD REFERRED VO:JJKK Larose Kells, M.D. PROCEDURE DATE:  10/11/2014 PROCEDURE:   Colonoscopy, screening and Colonoscopy with snare polypectomy First Screening Colonoscopy - Avg.  risk and is 50 yrs.  old or older Yes.  Prior Negative Screening - Now for repeat screening. N/A  History of Adenoma - Now for follow-up colonoscopy & has been > or = to 3 yrs.  N/A ASA CLASS:   Class II INDICATIONS:Screening for colonic neoplasia and Colorectal Neoplasm Risk Assessment for this procedure is average risk. MEDICATIONS: Monitored anesthesia care and Propofol 230 mg IV  DESCRIPTION OF PROCEDURE:   After the risks benefits and alternatives of the procedure were thoroughly explained, informed consent was obtained.  The digital rectal exam revealed no abnormalities of the rectum.   The LB [id number]  endoscope was introduced through the anus and advanced to the cecum, which was identified by both the appendix and ileocecal valve. No adverse events experienced.   The quality of the prep was excellent.  The instrument was then slowly withdrawn as the colon was fully examined.   COLON FINDINGS: Three sessile polyps ranging between 3-22mm in size were found in the sigmoid colon and transverse colon. Polypectomies were performed with a cold snare.  The resection was complete, the polyp tissue was completely retrieved and sent to histology.   A pedunculated polyp measuring 18 mm in size was found in the sigmoid colon.  A polypectomy was performed using snare cautery.  The resection was complete, the polyp tissue was completely retrieved and sent to histology.   The examination was otherwise normal.  Retroflexed views revealed no abnormalities. The time to cecum = 2.1 Withdrawal time =  10.2   The scope was withdrawn and the procedure completed. COMPLICATIONS: There were no immediate complications.  ENDOSCOPIC IMPRESSION: 1.  Three sessile polyps ranging between 3-31mm in size were found in the sigmoid colon and transverse colon; polypectomies were performed with a cold snare 2.   Pedunculated polyp was found in the sigmoid colon; polypectomy was performed using snare cautery 3.   The examination was otherwise normal  RECOMMENDATIONS: If the polyp(s) removed today are proven to be adenomatous (pre-cancerous) polyps, you will need a colonoscopy in 3 years. Otherwise you should continue to follow colorectal cancer screening guidelines for "routine risk" patients with a colonoscopy in 10 years.  You will receive a letter within 1-2 weeks with the results of your biopsy as well as final recommendations.  Please call my office if you have not received a letter after 3 weeks.  eSigned:  Milus Banister, MD 10/11/2014 1:49 PM revis   PATIENT NAME:  Cody, Fox MR#: [id number]

## 2014-10-11 NOTE — Progress Notes (Signed)
Called to room to assist during endoscopic procedure.  Patient ID and intended procedure confirmed with present staff. Received instructions for my participation in the procedure from the performing physician.  

## 2014-10-11 NOTE — Patient Instructions (Signed)
YOU HAD AN ENDOSCOPIC PROCEDURE TODAY AT [facility]:   Refer to the procedure report that was given to you for any specific questions about what was found during the examination.  If the procedure report does not answer your questions, please call your gastroenterologist to clarify.  If you requested that your care partner not be given the details of your procedure findings, then the procedure report has been included in a sealed envelope for you to review at your convenience later.  YOU SHOULD EXPECT: Some feelings of bloating in the abdomen. Passage of more gas than usual.  Walking can help get rid of the air that was put into your GI tract during the procedure and reduce the bloating. If you had a lower endoscopy (such as a colonoscopy or flexible sigmoidoscopy) you may notice spotting of blood in your stool or on the toilet paper. If you underwent a bowel prep for your procedure, you may not have a normal bowel movement for a few days.  Please Note:  You might notice some irritation and congestion in your nose or some drainage.  This is from the oxygen used during your procedure.  There is no need for concern and it should clear up in a day or so.  SYMPTOMS TO REPORT IMMEDIATELY:   Following lower endoscopy (colonoscopy or flexible sigmoidoscopy):  Excessive amounts of blood in the stool  Significant tenderness or worsening of abdominal pains  Swelling of the abdomen that is new, acute  Fever of 100F or higher  For urgent or emergent issues, a gastroenterologist can be reached at any hour by calling [phone].  DIET: Your first meal following the procedure should be a small meal and then it is ok to progress to your normal diet. Heavy or fried foods are harder to digest and may make you feel nauseous or bloated.  Likewise, meals heavy in dairy and vegetables can increase bloating.  Drink plenty of fluids but you should avoid alcoholic beverages for 24 hours.  ACTIVITY:   You should plan to take it easy for the rest of today and you should NOT DRIVE or use heavy machinery until tomorrow (because of the sedation medicines used during the test).    FOLLOW UP: Our staff will call the number listed on your records the next business day following your procedure to check on you and address any questions or concerns that you may have regarding the information given to you following your procedure. If we do not reach you, we will leave a message.  However, if you are feeling well and you are not experiencing any problems, there is no need to return our call.  We will assume that you have returned to your regular daily activities without incident.  If any biopsies were taken you will be contacted by phone or by letter within the next 1-3 weeks.  Please call us at [phone] if you have not heard about the biopsies in 3 weeks.    SIGNATURES/CONFIDENTIALITY: You and/or your care partner have signed paperwork which will be entered into your electronic medical record.  These signatures attest to the fact that that the information above on your After Visit Summary has been reviewed and is understood.  Full responsibility of the confidentiality of this discharge information lies with you and/or your care-partner.  Await pathology  Please read over handout about polyps  Please continue your normal medications

## 2014-10-14 ENCOUNTER — Telehealth: Payer: Self-pay | Admitting: *Deleted

## 2014-10-14 NOTE — Telephone Encounter (Signed)
  Follow up Call-  Call back number 10/11/2014  Post procedure Call Back phone  # [phone]  Permission to leave phone message Yes     Patient questions:  Do you have a fever, pain , or abdominal swelling? No. Pain Score  0 *  Have you tolerated food without any problems? Yes.    Have you been able to return to your normal activities? Yes.    Do you have any questions about your discharge instructions: Diet   No. Medications  No. Follow up visit  No.  Do you have questions or concerns about your Care? No.  Actions: * If pain score is 4 or above: No action needed, pain <4.

## 2014-10-28 ENCOUNTER — Encounter: Payer: Self-pay | Admitting: Gastroenterology

## 2014-12-13 ENCOUNTER — Ambulatory Visit (INDEPENDENT_AMBULATORY_CARE_PROVIDER_SITE_OTHER): Payer: PRIVATE HEALTH INSURANCE | Admitting: Internal Medicine

## 2014-12-13 ENCOUNTER — Encounter: Payer: Self-pay | Admitting: Internal Medicine

## 2014-12-13 VITALS — BP 132/84 | HR 68 | Temp 97.5°F | Ht 69.0 in | Wt 242.1 lb

## 2014-12-13 DIAGNOSIS — I10 Essential (primary) hypertension: Secondary | ICD-10-CM | POA: Diagnosis not present

## 2014-12-13 MED ORDER — LOSARTAN POTASSIUM 100 MG PO TABS: 100.0000 mg | ORAL_TABLET | Freq: Every day | ORAL | Status: DC

## 2014-12-13 MED ORDER — METOPROLOL SUCCINATE ER 100 MG PO TB24: 100.0000 mg | ORAL_TABLET | Freq: Every day | ORAL | Status: DC

## 2014-12-13 MED ORDER — AMLODIPINE BESYLATE 10 MG PO TABS: 10.0000 mg | ORAL_TABLET | Freq: Every day | ORAL | Status: DC

## 2014-12-13 NOTE — Progress Notes (Signed)
   Subjective:    Patient ID: Cody Fox, male    DOB: 1960/08/21, 54 y.o.   MRN: [id number]  DOS:  12/13/2014 Type of visit - description : rov Interval history: Hypertension, good medication compliance without apparent side effects. Ambulatory BPs 120-140/79- 80.    Review of Systems  States he feels  great, doing much more exercise, mostly weights. Diet is about the same Denies chest pain, difficulty breathing. Using compression stockings with good control of his mild edema. No nausea, vomiting, diarrhea.   Past Medical History  Diagnosis Date  . Hypertension   . Kidney stone   . 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 No past surgeries    . Kidney stone retrieval      via urethra    History   Social History  . Marital Status: Legally Separated    Spouse Name: N/A  . Number of Children: 0  . Years of Education: N/A   Occupational History  . [employer]     @ [employer]   Social History Main Topics  . Smoking status: Former Smoker    Quit date: 07/26/1993  . Smokeless tobacco: Never Used  . Alcohol Use: Yes     Comment: rarely  . Drug Use: No  . Sexual Activity: Not on file   Other Topics Concern  . Not on file   Social History Narrative   Married , 4 children (wife's)   no children of his own         Medication List       This list is accurate as of: 12/13/14 11:59 PM.  Always use your most recent med list.               amLODipine 10 MG tablet  Commonly known as:  NORVASC  Take 1 tablet (10 mg total) by mouth daily.     losartan 100 MG tablet  Commonly known as:  COZAAR  Take 1 tablet (100 mg total) by mouth daily.     metoprolol succinate 100 MG 24 hr tablet  Commonly known as:  TOPROL-XL  Take 1 tablet (100 mg total) by mouth daily. Take with or immediately following a meal.           Objective:   Physical Exam BP 132/84 mmHg  Pulse 68   Temp(Src) 97.5 F (36.4 C) (Oral)  Ht 5\' 9"  (1.753 m)  Wt 242 lb 2 oz (109.827 kg)  BMI 35.74 kg/m2  SpO2 97% General:   Well developed, well nourished . NAD.  HEENT:  Normocephalic . Face symmetric, atraumatic Lungs:  CTA B Normal respiratory effort, no intercostal retractions, no accessory muscle use. Heart: RRR,  no murmur.  No pretibial edema bilaterally  Skin: Not pale. Not jaundice Neurologic:  alert & oriented X3.  Speech normal, gait appropriate for age and unassisted Psych--  Cognition and judgment appear intact.  Cooperative with normal attention span and concentration.  Behavior appropriate. No anxious or depressed appearing.       Assessment & Plan:    Today , I spent more than  15  min with the patient: >50% of the time counseling regards  diet, exercise, need to restart aerobic exercising in a gradual fashion.

## 2014-12-13 NOTE — Assessment & Plan Note (Signed)
Doing great, refill medications. Ambulatory BPs within normal. Discuss diet and exercise Follow-up 05-2015 for a physical.

## 2014-12-13 NOTE — Progress Notes (Signed)
Pre visit review using our clinic review tool, if applicable. No additional management support is needed unless otherwise documented below in the visit note. 

## 2014-12-13 NOTE — Patient Instructions (Signed)
Next visit 05-2015 for a physical

## 2015-02-22 ENCOUNTER — Other Ambulatory Visit: Payer: Self-pay | Admitting: Internal Medicine

## 2015-06-16 ENCOUNTER — Telehealth: Payer: Self-pay | Admitting: Behavioral Health

## 2015-06-16 NOTE — Telephone Encounter (Signed)
Appointment cancelled for 06/17/15 at 3:30 PM. Patient voiced that he will call the office at a later date to reschedule.

## 2015-06-17 ENCOUNTER — Encounter: Payer: PRIVATE HEALTH INSURANCE | Admitting: Internal Medicine

## 2015-10-26 ENCOUNTER — Other Ambulatory Visit: Payer: Self-pay | Admitting: Internal Medicine

## 2015-11-05 ENCOUNTER — Telehealth: Payer: Self-pay | Admitting: Internal Medicine

## 2015-11-05 NOTE — Telephone Encounter (Signed)
[facility]  [facility], [place] [phone] (Phone) [phone] (Fax)         Reason for call: Request refill on losartan (COZAAR) 100 MG tablet [id number]

## 2015-11-05 NOTE — Telephone Encounter (Signed)
amLODipine (NORVASC) 10 MG tablet [id number]     Order Details    Dose: 10 mg Route: Oral Frequency: Daily   Order Comments:   Requested drug refills are authorized, however, the patient needs further evaluation and/or laboratory testing before further refills are given. Ask him to make an appointment for this.        Dispense Quantity:  90 tablet Refills:  0 Fills Remaining:  0          Sig: Take 1 tablet (10 mg total) by mouth daily.         Written Date:  10/27/15 Expiration Date:  10/26/16     Start Date:  10/27/15 End Date:  --     Ordering Provider:  Colon Branch, MD Authorizing Provider:  Colon Branch, MD Ordering User:  Damita Dunnings, CMA                    Original Order:  amLODipine (NORVASC) 10 MG tablet [id number]        [facility] [place], [place] Comments:  --         Quantity Remaining:  0 tablet Quantity Filled:  0 tablet

## 2015-11-05 NOTE — Telephone Encounter (Signed)
amLODipine (NORVASC) 10 MG tablet 90 tablet 0 10/27/2015       Sig - Route: Take 1 tablet (10 mg total) by mouth daily. - Oral     Notes to Pharmacy: Requested drug refills are authorized, however, the patient needs further evaluation and/or laboratory testing before further refills are given. Ask him to make an appointment for this.     E-Prescribing Status: Receipt confirmed by pharmacy (10/27/2015 9:15 AM EDT)

## 2015-11-10 NOTE — Telephone Encounter (Signed)
Pt called in to check the status of his Lasartan Rx. Informed by CMA that it is no longer needed so PCP will not refill. Pt expressed understanding. ALSO, informed pt that a CPE is needed. Pt says that he will call the office back once he has his work schedule to schedule. Okay to put 2 slots together per CMA.

## 2015-12-08 ENCOUNTER — Encounter: Payer: Self-pay | Admitting: Internal Medicine

## 2015-12-08 ENCOUNTER — Ambulatory Visit (INDEPENDENT_AMBULATORY_CARE_PROVIDER_SITE_OTHER): Payer: PRIVATE HEALTH INSURANCE | Admitting: Internal Medicine

## 2015-12-08 VITALS — BP 128/76 | HR 75 | Temp 98.1°F | Ht 69.0 in | Wt 239.0 lb

## 2015-12-08 DIAGNOSIS — Z23 Encounter for immunization: Secondary | ICD-10-CM

## 2015-12-08 DIAGNOSIS — R351 Nocturia: Secondary | ICD-10-CM | POA: Diagnosis not present

## 2015-12-08 DIAGNOSIS — Z0000 Encounter for general adult medical examination without abnormal findings: Secondary | ICD-10-CM

## 2015-12-08 DIAGNOSIS — Z09 Encounter for follow-up examination after completed treatment for conditions other than malignant neoplasm: Secondary | ICD-10-CM

## 2015-12-08 LAB — PSA: PSA: 3 ng/mL (ref 0.10–4.00)

## 2015-12-08 LAB — COMPREHENSIVE METABOLIC PANEL
ALBUMIN: 4.6 g/dL (ref 3.5–5.2)
ALT: 49 U/L (ref 0–53)
AST: 30 U/L (ref 0–37)
Alkaline Phosphatase: 71 U/L (ref 39–117)
BUN: 13 mg/dL (ref 6–23)
CO2: 32 mEq/L (ref 19–32)
CREATININE: 1 mg/dL (ref 0.40–1.50)
Calcium: 9.1 mg/dL (ref 8.4–10.5)
Chloride: 101 mEq/L (ref 96–112)
GFR: 83.19 mL/min (ref >60.00)
Glucose, Bld: 87 mg/dL (ref 70–99)
Potassium: 3.1 mEq/L — ABNORMAL LOW (ref 3.5–5.1)
Sodium: 143 mEq/L (ref 135–145)
Total Bilirubin: 0.8 mg/dL (ref 0.2–1.2)
Total Protein: 6.9 g/dL (ref 6.0–8.3)

## 2015-12-08 LAB — URINALYSIS, ROUTINE W REFLEX MICROSCOPIC
Hgb urine dipstick: NEGATIVE
LEUKOCYTES UA: NEGATIVE
NITRITE: NEGATIVE
PH: 6.5 (ref 5.0–8.0)
Specific Gravity, Urine: 1.025 (ref 1.000–1.030)
TOTAL PROTEIN, URINE-UPE24: 100 — AB
Urine Glucose: NEGATIVE
Urobilinogen, UA: 0.2 (ref 0.0–1.0)

## 2015-12-08 LAB — CBC WITH DIFFERENTIAL/PLATELET
BASOS ABS: 0.1 10*3/uL (ref 0.0–0.1)
Basophils Relative: 0.8 % (ref 0.0–3.0)
EOS ABS: 0.1 10*3/uL (ref 0.0–0.7)
Eosinophils Relative: 1.1 % (ref 0.0–5.0)
HCT: 43.7 % (ref 39.0–52.0)
Hemoglobin: 15.1 g/dL (ref 13.0–17.0)
LYMPHS ABS: 1.3 10*3/uL (ref 0.7–4.0)
Lymphocytes Relative: 15.8 % (ref 12.0–46.0)
MCHC: 34.5 g/dL (ref 30.0–36.0)
MCV: 85.1 fl (ref 78.0–100.0)
Monocytes Absolute: 0.8 10*3/uL (ref 0.1–1.0)
Monocytes Relative: 10 % (ref 3.0–12.0)
NEUTROS ABS: 5.8 10*3/uL (ref 1.4–7.7)
NEUTROS PCT: 72.3 % (ref 43.0–77.0)
PLATELETS: 345 10*3/uL (ref 150.0–400.0)
RBC: 5.14 Mil/uL (ref 4.22–5.81)
RDW: 13.6 % (ref 11.5–15.5)
WBC: 8.1 10*3/uL (ref 4.0–10.5)

## 2015-12-08 LAB — LIPID PANEL
CHOLESTEROL: 184 mg/dL (ref 0–200)
HDL: 28.6 mg/dL — ABNORMAL LOW (ref >39.00)
LDL Cholesterol: 128 mg/dL — ABNORMAL HIGH (ref 0–99)
NonHDL: 155.31
TRIGLYCERIDES: 137 mg/dL (ref 0.0–149.0)
Total CHOL/HDL Ratio: 6
VLDL: 27.4 mg/dL (ref 0.0–40.0)

## 2015-12-08 LAB — TSH: TSH: 3.2 u[IU]/mL (ref 0.35–4.50)

## 2015-12-08 MED ORDER — METOPROLOL SUCCINATE ER 100 MG PO TB24: 100.0000 mg | ORAL_TABLET | Freq: Every day | ORAL | Status: DC

## 2015-12-08 MED ORDER — TAMSULOSIN HCL 0.4 MG PO CAPS: 0.4000 mg | ORAL_CAPSULE | Freq: Every day | ORAL | Status: DC

## 2015-12-08 NOTE — Assessment & Plan Note (Addendum)
Td 11-2015  Colon cancer screening: Colonoscopy 09-2014, next 3 years Prostate cancer screening, DRE-PSA 2015 ok. Reports nocturia, declined DRE, plan--UA UCX PSA, start Flomax, avoid late drinking fluids.   Labs : CMP, FLP, CBC, TSH diet and exercise discussed -- he is actually trying to do better  RTC 4 months

## 2015-12-08 NOTE — Patient Instructions (Addendum)
GO TO THE LAB : Get the blood work     GO TO THE FRONT DESK Schedule your next appointment for a  follow-up in 4 months

## 2015-12-08 NOTE — Progress Notes (Signed)
Subjective:    Patient ID: Cody Fox, male    DOB: [birth year], 55 y.o.   MRN: [id number]  DOS:  12/08/2015 Type of visit - description : Complete physical exam Interval history: Not taking metoprolol for the last 3 weeks, apparently he simply ran out. His BP on the last few months including the last 3 weeks is checked sporadically and is okay.    Review of Systems Constitutional: No fever. No chills. No unexplained wt changes. No unusual sweats  HEENT: No dental problems, no ear discharge, no facial swelling, no voice changes. No eye discharge, no eye  redness , no  intolerance to light   Respiratory: No wheezing , no  difficulty breathing. No cough , no mucus production  Cardiovascular: No CP, no leg swelling , no  Palpitations  GI: no nausea, no vomiting, no diarrhea , no  abdominal pain.  No blood in the stools. No dysphagia, no odynophagia    Endocrine: No polyphagia, no polyuria , no polydipsia  GU: No dysuria, gross hematuria, difficulty urinating. No urinary urgency, no frequency but has nocturia for the last few years, getting slightly worse, currently around twice every night  Musculoskeletal: No joint swellings or unusual aches or pains  Skin: No change in the color of the skin, palor , no  Rash  Allergic, immunologic: + environmental allergies , no  food allergies  Neurological: No dizziness no  syncope. No headaches. No diplopia, no slurred, no slurred speech, no motor deficits, no facial  Numbness  Hematological: No enlarged lymph nodes, no easy bruising , no unusual bleedings  Psychiatry: No suicidal ideas, no hallucinations, no beavior problems, no confusion.  No unusual/severe anxiety, no depression   Past Medical History  Diagnosis Date  . Hypertension   . Kidney stone   . 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 Kidney stone retrieval      via urethra    Social  History   Social History  . Marital Status: Legally Separated    Spouse Name: N/A  . Number of Children: 0  . Years of Education: N/A   Occupational History  . [employer]     @ [employer]   Social History Main Topics  . Smoking status: Former Smoker    Quit date: 07/26/1993  . Smokeless tobacco: Never Used  . Alcohol Use: Yes     Comment: rarely  . Drug Use: No  . Sexual Activity: Not on file   Other Topics Concern  . Not on file   Social History Narrative   Married , 4 children (wife's)   no children of his own      Family History  Problem Relation Age of Onset  . Hypertension Father   . Coronary artery disease Neg Hx   . Cancer Neg Hx     prostate or colon  . Diabetes Neg Hx   . Colon cancer Neg Hx       Medication List       This list is accurate as of: 12/08/15 11:59 PM.  Always use your most recent med list.               amLODipine 10 MG tablet  Commonly known as:  NORVASC  Take 1 tablet (10 mg total) by mouth daily.     losartan 100 MG tablet  Commonly known as:  COZAAR  Take  1 tablet (100 mg total) by mouth daily.     metoprolol succinate 100 MG 24 hr tablet  Commonly known as:  TOPROL-XL  Take 1 tablet (100 mg total) by mouth daily. Take with or immediately following a meal.     tamsulosin 0.4 MG Caps capsule  Commonly known as:  FLOMAX  Take 1 capsule (0.4 mg total) by mouth daily.           Objective:   Physical Exam BP 128/76 mmHg  Pulse 75  Temp(Src) 98.1 F (36.7 C) (Oral)  Ht 5\' 9"  (1.753 m)  Wt 239 lb (108.41 kg)  BMI 35.28 kg/m2  SpO2 97%  General:   Well developed, well nourished . NAD.  Neck: No  thyromegaly  HEENT:  Normocephalic . Face symmetric, atraumatic Lungs:  CTA B Normal respiratory effort, no intercostal retractions, no accessory muscle use. Heart: RRR,  no murmur.  No pretibial edema bilaterally  Abdomen:  Not distended, soft, non-tender. No rebound or rigidity.   DRE : declined  Skin: Exposed  areas without rash. Not pale. Not jaundice Neurologic:  alert & oriented X3.  Speech normal, gait appropriate for age and unassisted Strength symmetric and appropriate for age.  Psych: Cognition and judgment appear intact.  Cooperative with normal attention span and concentration.  Behavior appropriate. No anxious or depressed appearing.    Assessment & Plan:   Assessment HTN Macular degeneration 2012 Kidney stones  Plan: HTN: On amlodipine, losartan and metoprolol. Run out of metoprolol 3 weeks ago, BP still satisfactory. Did not have any side effects w/ 3 meds ;  EKG--NSR. Plan: Continue with  3 meds. RTC 4 months

## 2015-12-08 NOTE — Progress Notes (Signed)
Pre visit review using our clinic review tool, if applicable. No additional management support is needed unless otherwise documented below in the visit note. 

## 2015-12-09 DIAGNOSIS — Z09 Encounter for follow-up examination after completed treatment for conditions other than malignant neoplasm: Secondary | ICD-10-CM | POA: Insufficient documentation

## 2015-12-09 LAB — HIV ANTIBODY (ROUTINE TESTING W REFLEX): HIV 1&2 Ab, 4th Generation: NONREACTIVE

## 2015-12-09 NOTE — Assessment & Plan Note (Signed)
HTN: On amlodipine, losartan and metoprolol. Run out of metoprolol 3 weeks ago, BP still satisfactory. Did not have any side effects w/ 3 meds ;  EKG--NSR. Plan: Continue with  3 meds. RTC 4 months

## 2015-12-10 LAB — URINE CULTURE: Colony Count: 4000

## 2015-12-19 ENCOUNTER — Encounter: Payer: PRIVATE HEALTH INSURANCE | Admitting: Internal Medicine

## 2016-02-08 ENCOUNTER — Telehealth: Payer: Self-pay | Admitting: Internal Medicine

## 2016-02-09 NOTE — Telephone Encounter (Signed)
Pt called in because he says that he spoke with carrier and they havent received refill. He would like to know if someone could follow up with pt?    CB: [phone]

## 2016-02-09 NOTE — Telephone Encounter (Signed)
I just refilled the medication this morning, it may take some time for [facility] to receive the refill order.

## 2016-04-09 ENCOUNTER — Encounter: Payer: Self-pay | Admitting: Internal Medicine

## 2016-04-09 ENCOUNTER — Ambulatory Visit (INDEPENDENT_AMBULATORY_CARE_PROVIDER_SITE_OTHER): Payer: PRIVATE HEALTH INSURANCE | Admitting: Internal Medicine

## 2016-04-09 VITALS — BP 134/78 | HR 67 | Temp 98.0°F | Resp 12 | Ht 69.0 in | Wt 243.4 lb

## 2016-04-09 DIAGNOSIS — I10 Essential (primary) hypertension: Secondary | ICD-10-CM | POA: Diagnosis not present

## 2016-04-09 DIAGNOSIS — Z23 Encounter for immunization: Secondary | ICD-10-CM | POA: Diagnosis not present

## 2016-04-09 DIAGNOSIS — R972 Elevated prostate specific antigen [PSA]: Secondary | ICD-10-CM

## 2016-04-09 DIAGNOSIS — R351 Nocturia: Secondary | ICD-10-CM

## 2016-04-09 LAB — PSA: PSA: 0.5 ng/mL (ref <=4.0)

## 2016-04-09 MED ORDER — AMLODIPINE BESYLATE 10 MG PO TABS: 10.0000 mg | ORAL_TABLET | Freq: Every day | ORAL | 2 refills | Status: DC

## 2016-04-09 MED ORDER — METOPROLOL SUCCINATE ER 100 MG PO TB24: 100.0000 mg | ORAL_TABLET | Freq: Every day | ORAL | 2 refills | Status: DC

## 2016-04-09 NOTE — Progress Notes (Signed)
Pre visit review using our clinic review tool, if applicable. No additional management support is needed unless otherwise documented below in the visit note. 

## 2016-04-09 NOTE — Progress Notes (Signed)
Subjective:    Patient ID: Cody Fox, male    DOB: January 18, 1961, 55 y.o.   MRN: [id number]  DOS:  04/09/2016 Type of visit - description : rov Interval history: HTN: Few months ago, couldn't refill losartan and stop taking it. Checks BPs infrequently but is usually normal.  Had nocturia, tried Flomax, did not like how he felt, self DC'd. Currently has nocturia twice a night and that doesn't bother him. Does not like any further treatment.  Review of Systems No chest pain or difficulty breathing No nausea, vomiting, diarrhea  Past Medical History:  Diagnosis Date  . Hypertension   . Kidney stone   . 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 kidney stone retrieval     via urethra    Social History   Social History  . Marital status: Legally Separated    Spouse name: N/A  . Number of children: 0  . Years of education: N/A   Occupational History  . [employer]    @ [employer]   Social History Main Topics  . Smoking status: Former Smoker    Quit date: 07/26/1993  . Smokeless tobacco: Never Used  . Alcohol use Yes     Comment: rarely  . Drug use: No  . Sexual activity: Not on file   Other Topics Concern  . Not on file   Social History Narrative   Married , divorced    no children         Medication List       Accurate as of 04/09/16  4:35 PM. Always use your most recent med list.          amLODipine 10 MG tablet Commonly known as:  NORVASC Take 1 tablet (10 mg total) by mouth daily.   losartan 100 MG tablet Commonly known as:  COZAAR Take 1 tablet (100 mg total) by mouth daily.   metoprolol succinate 100 MG 24 hr tablet Commonly known as:  TOPROL-XL Take 1 tablet (100 mg total) by mouth daily. Take with or immediately following a meal.          Objective:   Physical Exam BP 134/78 (BP Location: Left Arm, Patient Position: Sitting, Cuff Size:  Normal)   Pulse 67   Temp 98 F (36.7 C) (Oral)   Resp 12   Ht 5\' 9"  (1.753 m)   Wt 243 lb 6 oz (110.4 kg)   SpO2 98%   BMI 35.94 kg/m  General:   Well developed, well nourished . NAD.  HEENT:  Normocephalic . Face symmetric, atraumatic Lungs:  CTA B Normal respiratory effort, no intercostal retractions, no accessory muscle use. Heart: RRR,  no murmur.  Trace pretibial edema bilaterally  Rectal:  External abnormalities: none. Normal sphincter tone. No rectal masses or tenderness.  No stools found Prostate: Prostate gland firm and smooth, no enlargement, nodularity, tenderness, mass, asymmetry or induration.  Skin: Not pale. Not jaundice Neurologic:  alert & oriented X3.  Speech normal, gait appropriate for age and unassisted Psych--  Cognition and judgment appear intact.  Cooperative with normal attention span and concentration.  Behavior appropriate. No anxious or depressed appearing.      Assessment & Plan:  Assessment HTN Macular degeneration 2012 Kidney stones  PLAN: HTN: Good compliance with amlodipine and metoprolol but ran out of losartan but is currently not taking it. Ambulatory BPs normal, BP  today slightly high but was recheck --->  134/78. Plan: monitor  BP more closely, call if increase, okay to restart losartan if needed. Watch for increasing edema Increased PSA velocity : DRE negative today,check  a PSA Nocturia: Did not like Flomax, currently nocturia x 2 at night, not bothersome to the patient consequently no further medication. RTC CPX 11/2016.

## 2016-04-09 NOTE — Patient Instructions (Addendum)
GO TO THE LAB : Get the blood work     GO TO THE FRONT DESK Schedule your next appointment for a  physical exam by May 2018    Check the  blood pressure 2 or 3 times a   Week Be sure your blood pressure is between 110/65 and  140/85. If it is consistently higher or lower, let me know

## 2016-04-11 NOTE — Assessment & Plan Note (Signed)
HTN: Good compliance with amlodipine and metoprolol but ran out of losartan but is currently not taking it. Ambulatory BPs normal, BP today slightly high but was recheck --->  134/78. Plan: monitor  BP more closely, call if increase, okay to restart losartan if needed. Watch for increasing edema Increased PSA velocity : DRE negative today,check  a PSA Nocturia: Did not like Flomax, currently nocturia x 2 at night, not bothersome to the patient consequently no further medication. RTC CPX 11/2016.

## 2016-08-19 IMAGING — US US ABDOMEN COMPLETE
1 series · 14 of 25 positions shown · non-contrast
Comparison: July 23, 2009.

CLINICAL DATA: Left-sided abdominal pain for 4 days.

EXAM:
ULTRASOUND ABDOMEN COMPLETE

[Series 1: us abdomen complete · 0.32mm/px · 14 of 92 slices shown]
[im 1/92]
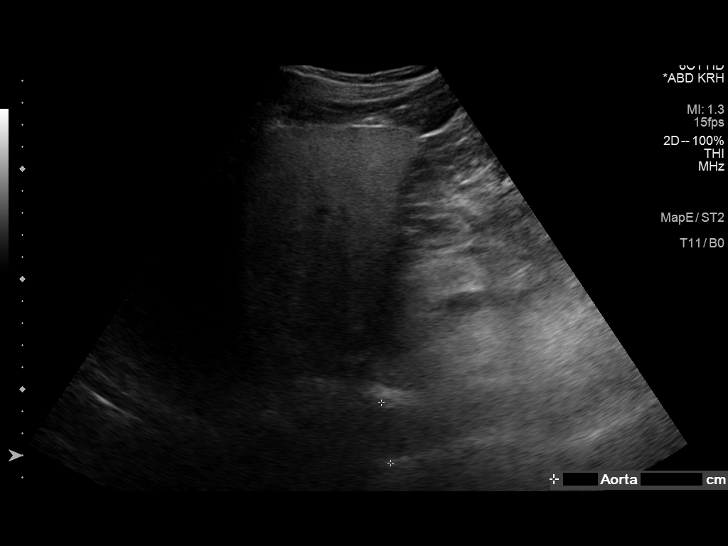
[im 8/92]
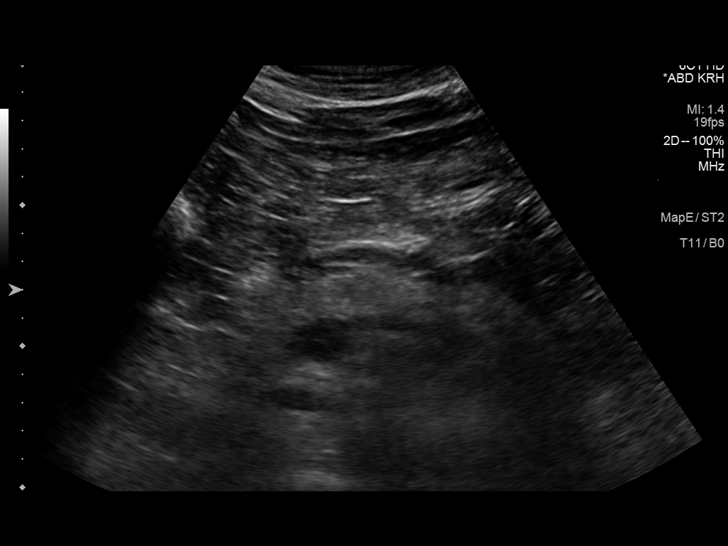
[im 16/92]
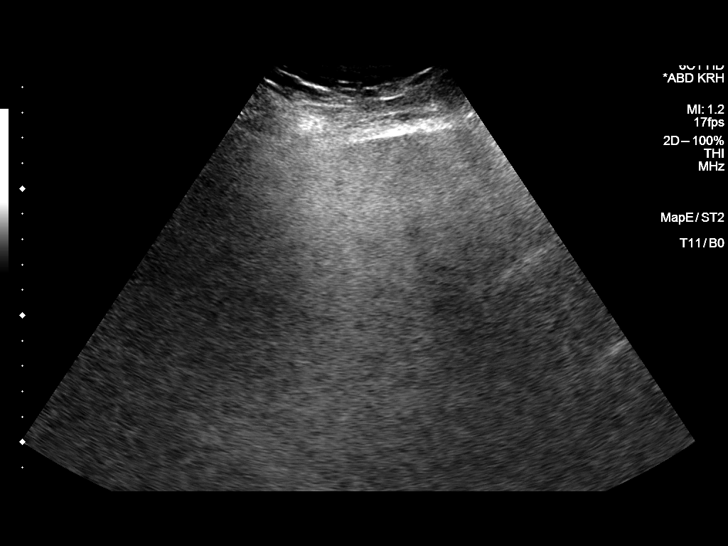
[im 23/92]
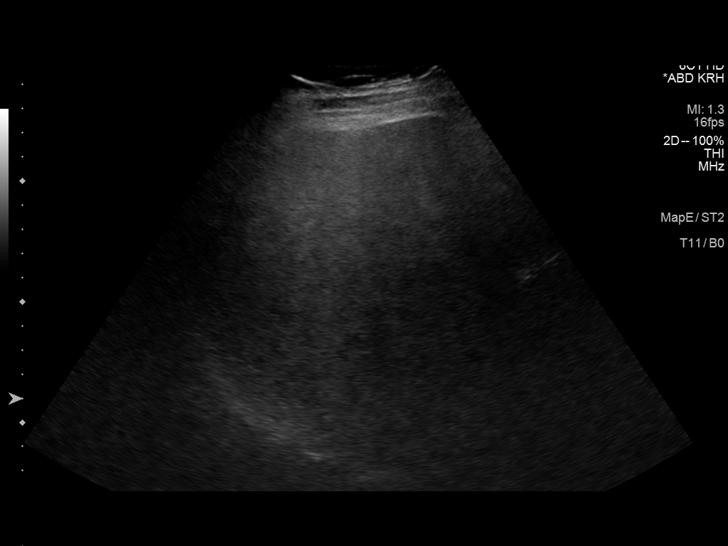
[im 31/92]
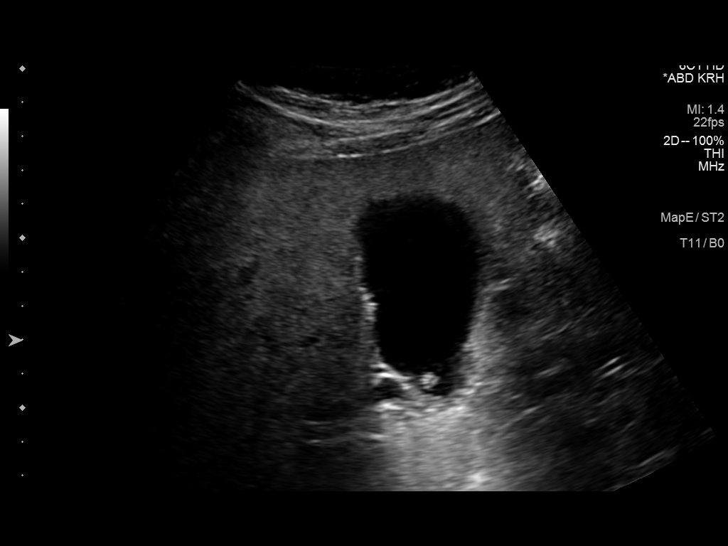
[im 35/92]
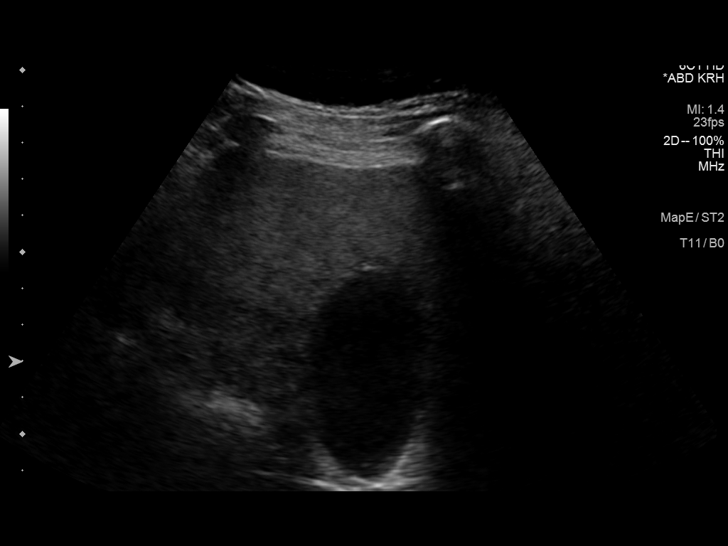
[im 42/92]
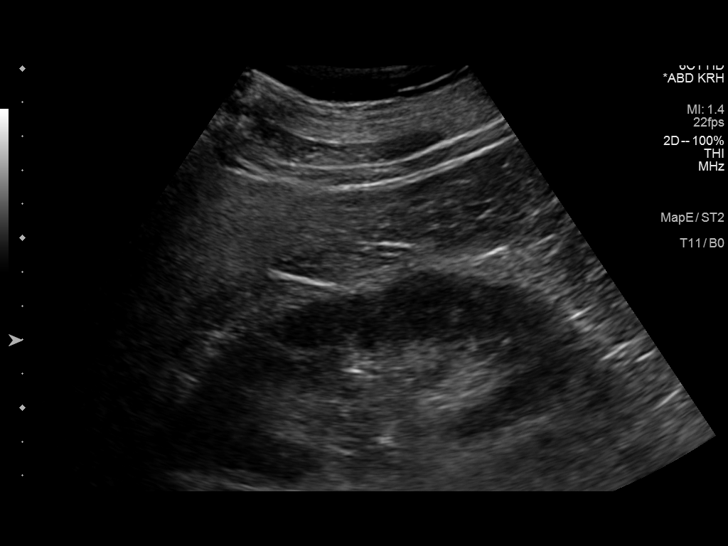
[im 50/92]
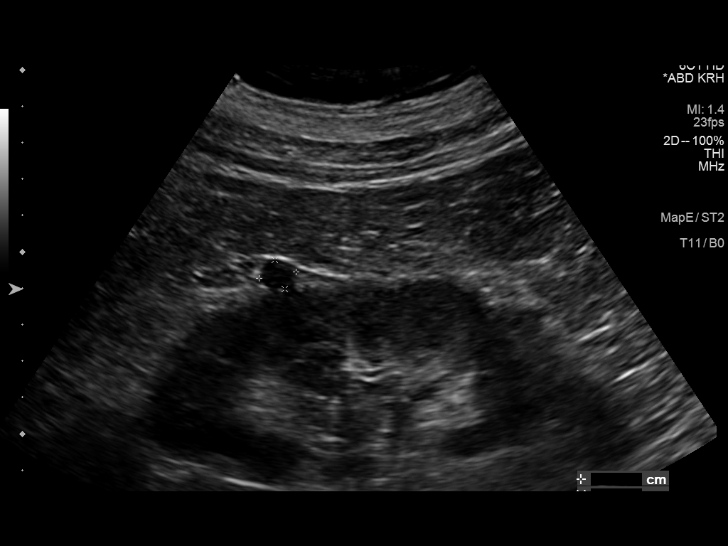
[im 57/92]
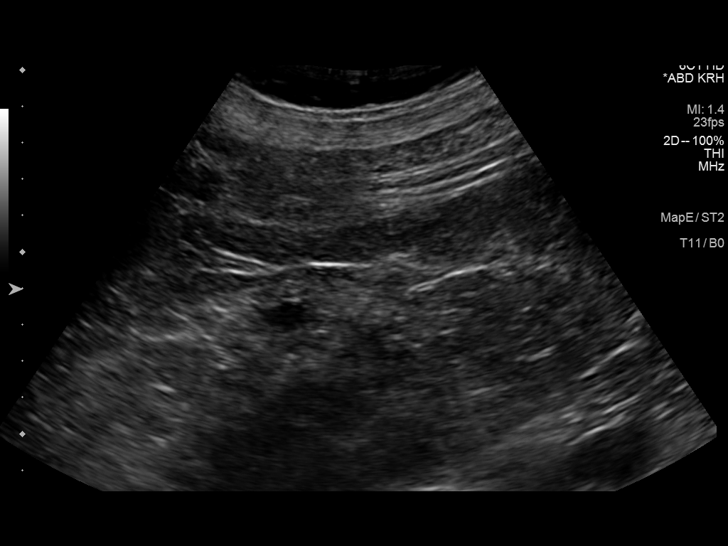
[im 61/92]
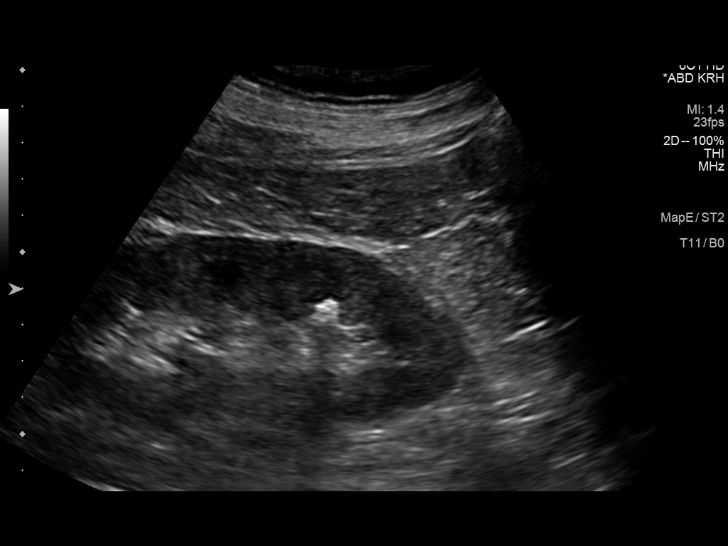
[im 69/92]
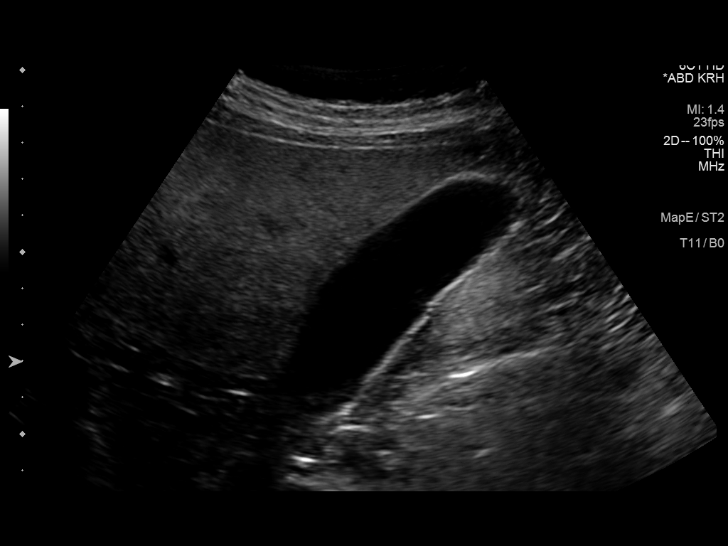
[im 76/92]
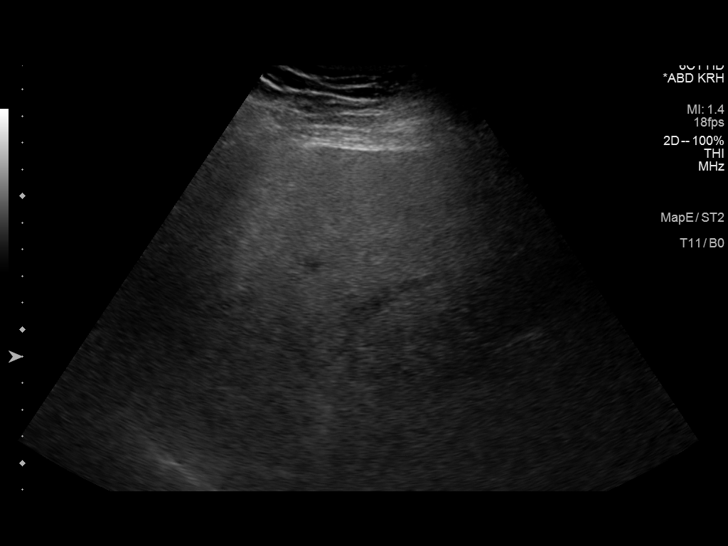
[im 84/92]
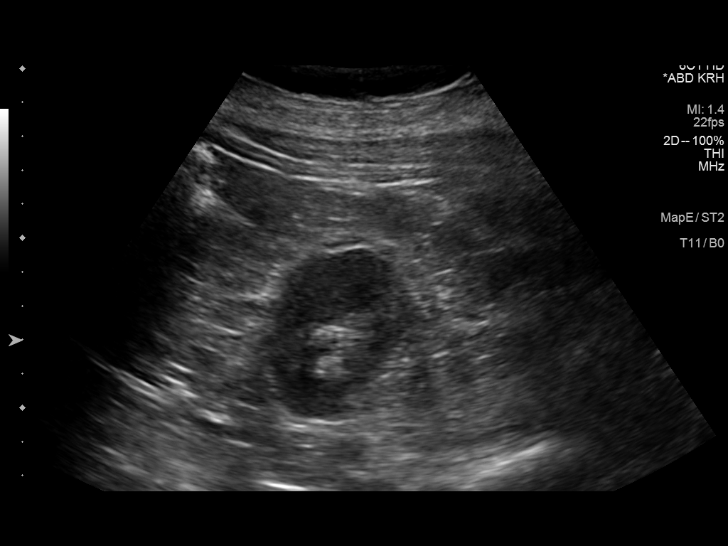
[im 92/92]
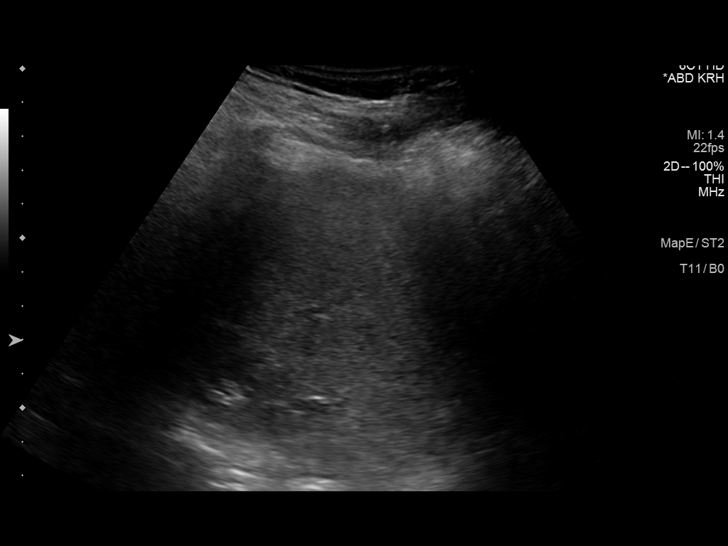

[14 of 25 positions shown; findings below may reference images not displayed]

FINDINGS: Gallbladder: No gallstones or wall thickening visualized. No
sonographic Murphy sign noted. Probable 6 mm polyp seen for the
neck.

Common bile duct: Diameter: 4 mm which is within normal limits.

Liver: No focal lesion identified. Increased echogenicity is noted
suggesting fatty infiltration.

IVC: Not well visualized.

Pancreas: Visualized portion unremarkable.

Spleen: Size and appearance within normal limits.

Right Kidney: Length: 12.5 cm. 8 mm nonobstructive calculus seen in
lower pole. Echogenicity within normal limits. No mass or
hydronephrosis visualized.

Left Kidney: Length: 10.9 cm. Echogenicity within normal limits. No
mass or hydronephrosis visualized.

Abdominal aorta: No aneurysm visualized.

Other findings: None.
IMPRESSION: Probable 6 mm gallbladder polyp. Probable fatty infiltration of the
liver. Nonobstructive right renal calculus. No other abnormality
seen in the abdomen.

## 2016-11-07 ENCOUNTER — Other Ambulatory Visit: Payer: Self-pay | Admitting: Internal Medicine

## 2016-12-10 ENCOUNTER — Encounter: Payer: Self-pay | Admitting: Internal Medicine

## 2016-12-10 ENCOUNTER — Ambulatory Visit (INDEPENDENT_AMBULATORY_CARE_PROVIDER_SITE_OTHER): Payer: 59 | Admitting: Internal Medicine

## 2016-12-10 VITALS — BP 122/86 | HR 68 | Temp 98.1°F | Ht 69.0 in | Wt 245.0 lb

## 2016-12-10 DIAGNOSIS — E876 Hypokalemia: Secondary | ICD-10-CM

## 2016-12-10 DIAGNOSIS — Z0000 Encounter for general adult medical examination without abnormal findings: Secondary | ICD-10-CM | POA: Diagnosis not present

## 2016-12-10 LAB — CBC WITH DIFFERENTIAL/PLATELET
BASOS PCT: 1 %
Basophils Absolute: 85 cells/uL (ref 0–200)
EOS PCT: 2 %
Eosinophils Absolute: 170 cells/uL (ref 15–500)
HEMATOCRIT: 41.7 % (ref 38.5–50.0)
Hemoglobin: 14 g/dL (ref 13.2–17.1)
LYMPHS ABS: 1870 {cells}/uL (ref 850–3900)
Lymphocytes Relative: 22 %
MCH: 28.9 pg (ref 27.0–33.0)
MCHC: 33.6 g/dL (ref 32.0–36.0)
MCV: 86.2 fL (ref 80.0–100.0)
MPV: 8.8 fL (ref 7.5–12.5)
Monocytes Absolute: 595 cells/uL (ref 200–950)
Monocytes Relative: 7 %
NEUTROS ABS: 5780 {cells}/uL (ref 1500–7800)
Neutrophils Relative %: 68 %
PLATELETS: 318 10*3/uL (ref 140–400)
RBC: 4.84 MIL/uL (ref 4.20–5.80)
RDW: 14.2 % (ref 11.0–15.0)
WBC: 8.5 10*3/uL (ref 3.8–10.8)

## 2016-12-10 LAB — COMPREHENSIVE METABOLIC PANEL
ALBUMIN: 4.3 g/dL (ref 3.6–5.1)
ALT: 42 U/L (ref 9–46)
AST: 27 U/L (ref 10–35)
Alkaline Phosphatase: 61 U/L (ref 40–115)
BILIRUBIN TOTAL: 0.5 mg/dL (ref 0.2–1.2)
BUN: 16 mg/dL (ref 7–25)
CO2: 29 mmol/L (ref 20–31)
CREATININE: 1.01 mg/dL (ref 0.70–1.33)
Calcium: 8.8 mg/dL (ref 8.6–10.3)
Chloride: 101 mmol/L (ref 98–110)
Glucose, Bld: 96 mg/dL (ref 65–99)
Potassium: 3.1 mmol/L — ABNORMAL LOW (ref 3.5–5.3)
Sodium: 141 mmol/L (ref 135–146)
TOTAL PROTEIN: 6.5 g/dL (ref 6.1–8.1)

## 2016-12-10 LAB — LIPID PANEL
CHOLESTEROL: 188 mg/dL (ref <200)
HDL: 31 mg/dL — ABNORMAL LOW (ref >40)
LDL Cholesterol: 118 mg/dL — ABNORMAL HIGH (ref <100)
TRIGLYCERIDES: 195 mg/dL — AB (ref <150)
Total CHOL/HDL Ratio: 6.1 Ratio — ABNORMAL HIGH (ref <5.0)
VLDL: 39 mg/dL — ABNORMAL HIGH (ref <30)

## 2016-12-10 NOTE — Assessment & Plan Note (Addendum)
--  Td 11-2015 --Colon cancer screening: Colonoscopy 09-2014, next 3 years --Prostate cancer screening: PAS was high 11-2015, a PSA and DRE were done  03-2016  Both normal; recheck a PSA today. --Labs :  CMP, FLP, CBC, TSH, PSA --Diet and exercise  discussed.

## 2016-12-10 NOTE — Progress Notes (Signed)
Subjective:    Patient ID: Cody Fox, male    DOB: 1961-02-17, 56 y.o.   MRN: [id number]  DOS:  12/10/2016 Type of visit - description : CPX Interval history: No concerns, good medication compliance.   Review of Systems Room for improvement on lifestyle. Denies any L UTS symptoms. Vision is okay.  A 14 point review of systems is negative    Past Medical History:  Diagnosis Date  . Hypertension   . Kidney stone   . 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 kidney stone retrieval     via urethra    Social History   Social History  . Marital status: Divorced    Spouse name: N/A  . Number of children: 0  . Years of education: N/A   Occupational History  . 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 Smoking status: Former Smoker    Quit date: 07/26/1993  . Smokeless tobacco: Never Used  . Alcohol use Yes     Comment: rarely  . Drug use: No  . Sexual activity: Not on file   Other Topics Concern  . Not on file   Social History Narrative   divorced    no children      Family History  Problem Relation Age of Onset  . Hypertension Father   . Coronary artery disease Neg Hx   . Cancer Neg Hx        prostate or colon  . Diabetes Neg Hx   . Colon cancer Neg Hx      Allergies as of 12/10/2016   No Known Allergies     Medication List       Accurate as of 12/10/16 11:59 PM. Always use your most recent med list.          amLODipine 10 MG tablet Commonly known as:  NORVASC Take 1 tablet (10 mg total) by mouth daily.   metoprolol succinate 100 MG 24 hr tablet Commonly known as:  TOPROL-XL Take 1 tablet (100 mg total) by mouth daily. Take with or immediately following a meal.          Objective:   Physical Exam BP 122/86   Pulse 68   Temp 98.1 F (36.7 C) (Oral)   Ht _0  (1.753 m)   Wt 245 lb (111.1 kg)   SpO2 98%    BMI 36.18 kg/m    General:   Well developed, well nourished . NAD.  Neck: No  thyromegaly  HEENT:  Normocephalic . Face symmetric, atraumatic Lungs:  CTA B Normal respiratory effort, no intercostal retractions, no accessory muscle use. Heart: RRR,  no murmur.  No pretibial edema bilaterally  Abdomen:  Not distended, soft, non-tender. No rebound or rigidity.   Skin: Exposed areas without rash. Not pale. Not jaundice Neurologic:  alert & oriented X3.  Speech normal, gait appropriate for age and unassisted Strength symmetric and appropriate for age.  Psych: Cognition and judgment appear intact.  Cooperative with normal attention span and concentration.  Behavior appropriate. No anxious or depressed appearing.    Assessment & Plan:   Assessment HTN Macular degeneration 2012 Kidney stones  PLAN: Here for a CPX HTN: Seems well-controlled with metoprolol and amlodipine, no change, recommend to monitor BPs, labs today. RF as needed Macular degeneration: Vision is stable-good. RTC one year.

## 2016-12-10 NOTE — Patient Instructions (Signed)
GO TO THE LAB : Get the blood work     GO TO THE FRONT DESK Schedule your next appointment for a  physical exam in one year.   Call sooner if needed   Check the  blood pressure   monthly   Be sure your blood pressure is between 110/65 and  140/85. If it is consistently higher or lower, let me know

## 2016-12-11 LAB — TSH: TSH: 1.56 mIU/L (ref 0.40–4.50)

## 2016-12-11 LAB — PSA: PSA: 0.6 ng/mL (ref <=4.0)

## 2016-12-12 NOTE — Assessment & Plan Note (Signed)
Here for a CPX HTN: Seems well-controlled with metoprolol and amlodipine, no change, recommend to monitor BPs, labs today. RF as needed Macular degeneration: Vision is stable-good. RTC one year.

## 2016-12-16 NOTE — Addendum Note (Signed)
Addended byDamita Dunnings D on: 12/16/2016 01:32 PM   Modules accepted: Orders

## 2017-04-20 ENCOUNTER — Encounter: Payer: Self-pay | Admitting: Internal Medicine

## 2017-04-23 ENCOUNTER — Other Ambulatory Visit: Payer: Self-pay | Admitting: Internal Medicine

## 2017-06-01 ENCOUNTER — Other Ambulatory Visit: Payer: Self-pay | Admitting: Internal Medicine

## 2017-10-17 ENCOUNTER — Encounter: Payer: Self-pay | Admitting: Gastroenterology

## 2017-12-16 ENCOUNTER — Encounter: Payer: Self-pay | Admitting: Internal Medicine

## 2017-12-16 ENCOUNTER — Ambulatory Visit (INDEPENDENT_AMBULATORY_CARE_PROVIDER_SITE_OTHER): Payer: 59 | Admitting: Internal Medicine

## 2017-12-16 VITALS — BP 134/68 | HR 68 | Temp 98.0°F | Resp 16 | Ht 69.0 in | Wt 243.5 lb

## 2017-12-16 DIAGNOSIS — Z0000 Encounter for general adult medical examination without abnormal findings: Secondary | ICD-10-CM | POA: Diagnosis not present

## 2017-12-16 DIAGNOSIS — Z1159 Encounter for screening for other viral diseases: Secondary | ICD-10-CM

## 2017-12-16 NOTE — Progress Notes (Signed)
Pre visit review using our clinic review tool, if applicable. No additional management support is needed unless otherwise documented below in the visit note. 

## 2017-12-16 NOTE — Progress Notes (Signed)
Subjective:    Patient ID: Cody Fox, male    DOB: [birth year], 57 y.o.   MRN: [id number]  DOS:  12/16/2017  Type of visit - description : cpx Interval history: No major concerns   Review of Systems  A 14 point review of systems is negative    Past Medical History:  Diagnosis Date  . Hypertension   . Kidney stone   . 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 kidney stone retrieval     via urethra    Social History   Socioeconomic History  . Marital status: Divorced    Spouse name: Not on file  . Number of children: 0  . Years of education: Not on file  . Highest education level: Not on file  Occupational History  . 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 Financial resource strain: Not on file  . Food insecurity:    Worry: Not on file    Inability: Not on file  . Transportation needs:    Medical: Not on file    Non-medical: Not on file  Tobacco Use  . Smoking status: Former Smoker    Last attempt to quit: 07/26/1993    Years since quitting: 24.4  . Smokeless tobacco: Never Used  Substance and Sexual Activity  . Alcohol use: Yes    Comment: rarely  . Drug use: No  . Sexual activity: Not on file  Lifestyle  . Physical activity:    Days per week: Not on file    Minutes per session: Not on file  . Stress: Not on file  Relationships  . Social connections:    Talks on phone: Not on file    Gets together: Not on file    Attends religious service: Not on file    Active member of club or organization: Not on file    Attends meetings of clubs or organizations: Not on file    Relationship status: Not on file  . Intimate partner violence:    Fear of current or ex partner: Not on file    Emotionally abused: Not on file    Physically abused: Not on file    Forced sexual activity: Not on file  Other Topics Concern  .  Not on file  Social History Narrative   divorced    no children      Family History  Problem Relation Age of Onset  . Hypertension Father   . Coronary artery disease Neg Hx   . Cancer Neg Hx        prostate or colon  . Diabetes Neg Hx   . Colon cancer Neg Hx      Allergies as of 12/16/2017   No Known Allergies     Medication List        Accurate as of 12/16/17 11:59 PM. Always use your most recent med list.          amLODipine 10 MG tablet Commonly known as:  NORVASC Take 1 tablet (10 mg total) by mouth daily.   metoprolol succinate 100 MG 24 hr tablet Commonly known as:  TOPROL-XL Take 1 tablet (100 mg total) daily by mouth. Take with or immediately following a meal.          Objective:   Physical Exam  Skin:  BP 134/68 (BP Location: Left Arm, Patient Position: Sitting, Cuff Size: Normal)   Pulse 68   Temp 98 F (36.7 C) (Oral)   Resp 16   Ht _0  (1.753 m)   Wt 243 lb 8 oz (110.5 kg)   SpO2 97%   BMI 35.96 kg/m  General:   Well developed, well nourished . NAD.  Neck: No  thyromegaly  HEENT:  Normocephalic . Face symmetric, atraumatic Lungs:  CTA B Normal respiratory effort, no intercostal retractions, no accessory muscle use. Heart: RRR,  no murmur.  No pretibial edema bilaterally  Abdomen:  Not distended, soft, non-tender. No rebound or rigidity.   Rectal: External abnormalities: none. Normal sphincter tone. No rectal masses or tenderness.  No stools found Prostate: Prostate gland firm and smooth, no enlargement, nodularity, tenderness, mass, asymmetry or induration Neurologic:  alert & oriented X3.  Speech normal, gait appropriate for age and unassisted Strength symmetric and appropriate for age.  Psych: Cognition and judgment appear intact.  Cooperative with normal attention span and concentration.  Behavior appropriate. No anxious or depressed appearing.     Assessment & Plan:   Assessment HTN Macular degeneration  2012 Kidney stones  PLAN: HTN: Well-controlled, ambulatory BPs in the 130/80.  Continue amlodipine and metoprolol.  No edema with inconsistent use of compression stockings. Low potassium: Mild but chronic issue, previous nephrology referral failed, the patient however will be willing to go if the potassium come back low again. Mole:  See physical exam, patient reports has been there since birth, states he is able to monitor it.  Recommend to call me if there is any changes. Since last year, has developed pollen allergies and contact dermatitis to poison ivy.  Counseled. RTC 1 year

## 2017-12-16 NOTE — Assessment & Plan Note (Addendum)
--  Td 11-2015 --Colon cancer screening: Colonoscopy 09-2014, due for repeat colonoscopy, GI letter reprinted  --Prostate cancer screening: DRE normal today, check a PSA --Labs :  CMP, FLP, CBC, PSA, hep C --Diet and exercise  discussed.

## 2017-12-16 NOTE — Patient Instructions (Addendum)
GO TO THE LAB : Get the blood work     GO TO THE FRONT DESK Schedule your next appointment for a physical exam in 1 year   Check the  blood pressure  monthly   Be sure your blood pressure is between 110/65 and  135/85. If it is consistently higher or lower, let me know    

## 2017-12-18 LAB — LIPID PANEL
CHOL/HDL RATIO: 5.8 (calc) — AB (ref <5.0)
CHOLESTEROL: 184 mg/dL (ref <200)
HDL: 32 mg/dL — AB (ref >40)
LDL CHOLESTEROL (CALC): 124 mg/dL — AB
Non-HDL Cholesterol (Calc): 152 mg/dL (calc) — ABNORMAL HIGH (ref <130)
TRIGLYCERIDES: 168 mg/dL — AB (ref <150)

## 2017-12-18 LAB — COMPREHENSIVE METABOLIC PANEL
AG Ratio: 2.1 (calc) (ref 1.0–2.5)
ALKALINE PHOSPHATASE (APISO): 59 U/L (ref 40–115)
ALT: 46 U/L (ref 9–46)
AST: 39 U/L — ABNORMAL HIGH (ref 10–35)
Albumin: 4.2 g/dL (ref 3.6–5.1)
BILIRUBIN TOTAL: 0.5 mg/dL (ref 0.2–1.2)
BUN: 18 mg/dL (ref 7–25)
CALCIUM: 8.9 mg/dL (ref 8.6–10.3)
CO2: 27 mmol/L (ref 20–32)
Chloride: 100 mmol/L (ref 98–110)
Creat: 1.05 mg/dL (ref 0.70–1.33)
Globulin: 2 g/dL (calc) (ref 1.9–3.7)
Glucose, Bld: 99 mg/dL (ref 65–99)
Potassium: 2.9 mmol/L — ABNORMAL LOW (ref 3.5–5.3)
SODIUM: 141 mmol/L (ref 135–146)
TOTAL PROTEIN: 6.2 g/dL (ref 6.1–8.1)

## 2017-12-18 LAB — CBC WITH DIFFERENTIAL/PLATELET
BASOS ABS: 47 {cells}/uL (ref 0–200)
Basophils Relative: 0.5 %
EOS ABS: 167 {cells}/uL (ref 15–500)
EOS PCT: 1.8 %
HEMATOCRIT: 41.1 % (ref 38.5–50.0)
HEMOGLOBIN: 14.2 g/dL (ref 13.2–17.1)
LYMPHS ABS: 1721 {cells}/uL (ref 850–3900)
MCH: 29.1 pg (ref 27.0–33.0)
MCHC: 34.5 g/dL (ref 32.0–36.0)
MCV: 84.2 fL (ref 80.0–100.0)
MPV: 9.5 fL (ref 7.5–12.5)
Monocytes Relative: 7.9 %
NEUTROS ABS: 6631 {cells}/uL (ref 1500–7800)
Neutrophils Relative %: 71.3 %
Platelets: 352 10*3/uL (ref 140–400)
RBC: 4.88 10*6/uL (ref 4.20–5.80)
RDW: 13.5 % (ref 11.0–15.0)
Total Lymphocyte: 18.5 %
WBC: 9.3 10*3/uL (ref 3.8–10.8)
WBCMIX: 735 {cells}/uL (ref 200–950)

## 2017-12-18 LAB — HEPATITIS C ANTIBODY
Hepatitis C Ab: NONREACTIVE
SIGNAL TO CUT-OFF: 0.02 (ref <1.00)

## 2017-12-18 LAB — PSA: PSA: 0.7 ng/mL (ref <=4.0)

## 2017-12-18 NOTE — Assessment & Plan Note (Signed)
PLAN: HTN: Well-controlled, ambulatory BPs in the 130/80.  Continue amlodipine and metoprolol.  No edema with inconsistent use of compression stockings. Low potassium: Mild but chronic issue, previous nephrology referral failed, the patient however will be willing to go if the potassium come back low again. Mole:  See physical exam, patient reports has been there since birth, states he is able to monitor it.  Recommend to call me if there is any changes. Since last year, has developed pollen allergies and contact dermatitis to poison ivy.  Counseled. RTC 1 year

## 2017-12-21 ENCOUNTER — Ambulatory Visit (INDEPENDENT_AMBULATORY_CARE_PROVIDER_SITE_OTHER): Payer: 59

## 2017-12-21 DIAGNOSIS — E876 Hypokalemia: Secondary | ICD-10-CM

## 2017-12-21 DIAGNOSIS — E878 Other disorders of electrolyte and fluid balance, not elsewhere classified: Secondary | ICD-10-CM | POA: Diagnosis not present

## 2017-12-21 NOTE — Progress Notes (Signed)
Per Dr. Larose Kells- EKG normal. Will refer to ENDO for hypokalemia. Pt verbalized understanding.

## 2017-12-21 NOTE — Addendum Note (Signed)
Addended byDamita Dunnings D on: 12/21/2017 12:03 PM   Modules accepted: Orders

## 2018-01-04 ENCOUNTER — Other Ambulatory Visit: Payer: Self-pay | Admitting: Internal Medicine

## 2018-01-28 ENCOUNTER — Telehealth: Payer: Self-pay | Admitting: Internal Medicine

## 2018-01-28 NOTE — Telephone Encounter (Signed)
Please f/u on endo referral

## 2018-01-30 NOTE — Telephone Encounter (Signed)
Had to be approved by Cody Fox- looks like referral status has finally been approved.

## 2018-02-16 ENCOUNTER — Other Ambulatory Visit: Payer: Self-pay | Admitting: Internal Medicine

## 2018-03-13 NOTE — Telephone Encounter (Signed)
Appt scheduled for 03/31/2018.

## 2018-03-31 ENCOUNTER — Encounter: Payer: Self-pay | Admitting: Endocrinology

## 2018-03-31 ENCOUNTER — Ambulatory Visit: Payer: 59 | Admitting: Endocrinology

## 2018-03-31 VITALS — BP 142/92 | HR 70 | Ht 69.0 in | Wt 251.0 lb

## 2018-03-31 DIAGNOSIS — Z23 Encounter for immunization: Secondary | ICD-10-CM | POA: Diagnosis not present

## 2018-03-31 DIAGNOSIS — E876 Hypokalemia: Secondary | ICD-10-CM

## 2018-03-31 DIAGNOSIS — I159 Secondary hypertension, unspecified: Secondary | ICD-10-CM

## 2018-03-31 NOTE — Progress Notes (Signed)
Patient ID: Cody Fox, male   DOB: 1960/12/23, 57 y.o.   MRN: [id number]          Referring physician: Belinda Fisher  Chief complaint: Low potassium  History of Present Illness:  HYPOKALEMIA/hypertension:  Patient states that he has had high blood pressure was since he first started seeing a physician for general care, probably at age 25 For several years he has had a low potassium level also to a variable extent  He has not been on any diuretics and has not been supplemented with potassium tablets He says he does not have any symptoms of muscle cramps or muscle weakness and has no history of cardiac arrhythmias  Lab Results  Component Value Date   K 2.9 (L) 12/16/2017     Past Medical History:  Diagnosis Date  . Hypertension   . Kidney stone   . 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 kidney stone retrieval     via urethra    Family History  Problem Relation Age of Onset  . Hypertension Father   . Coronary artery disease Neg Hx   . Cancer Neg Hx        prostate or colon  . Diabetes Neg Hx   . Colon cancer Neg Hx     Social History:  reports that he quit smoking about 24 years ago. He has never used smokeless tobacco. He reports that he drinks alcohol. He reports that he does not use drugs.  Allergies: No Known Allergies  Allergies as of 03/31/2018   No Known Allergies     Medication List        Accurate as of 03/31/18 11:48 AM. Always use your most recent med list.          amLODipine 10 MG tablet Commonly known as:  NORVASC Take 1 tablet (10 mg total) by mouth daily.   metoprolol succinate 100 MG 24 hr tablet Commonly known as:  TOPROL-XL Take 1 tablet (100 mg total) by mouth daily. Take with or immediately following a meal       LABS:  No visits with results within 1 Week(s) from this visit.  Latest known visit with results is:  Office Visit on 12/16/2017  Component  Date Value Ref Range Status  . Glucose, Bld 12/16/2017 99  65 - 99 mg/dL Final   Comment: .            Fasting reference interval .   . BUN 12/16/2017 18  7 - 25 mg/dL Final  . Creat 12/16/2017 1.05  0.70 - 1.33 mg/dL Final   Comment: For patients >75 years of age, the reference limit for Creatinine is approximately 13% higher for people identified as African-American. .   Havery Moros Ratio 72/53/6644 NOT APPLICABLE  6 - 22 (calc) Final  . Sodium 12/16/2017 141  135 - 146 mmol/L Final  . Potassium 12/16/2017 2.9* 3.5 - 5.3 mmol/L Final  . Chloride 12/16/2017 100  98 - 110 mmol/L Final  . CO2 12/16/2017 27  20 - 32 mmol/L Final  . Calcium 12/16/2017 8.9  8.6 - 10.3 mg/dL Final  . Total Protein 12/16/2017 6.2  6.1 - 8.1 g/dL Final  . Albumin 12/16/2017 4.2  3.6 - 5.1 g/dL Final  . Globulin 12/16/2017 2.0  1.9 - 3.7 g/dL (calc) Final  . AG Ratio 12/16/2017 2.1  1.0 - 2.5 (calc) Final  .  Total Bilirubin 12/16/2017 0.5  0.2 - 1.2 mg/dL Final  . Alkaline phosphatase (APISO) 12/16/2017 59  40 - 115 U/L Final  . AST 12/16/2017 39* 10 - 35 U/L Final  . ALT 12/16/2017 46  9 - 46 U/L Final  . Cholesterol 12/16/2017 184  <200 mg/dL Final  . HDL 12/16/2017 32* >40 mg/dL Final  . Triglycerides 12/16/2017 168* <150 mg/dL Final  . LDL Cholesterol (Calc) 12/16/2017 124* mg/dL (calc) Final   Comment: Reference range: <100 . Desirable range <100 mg/dL for primary prevention;   <70 mg/dL for patients with CHD or diabetic patients  with > or = 2 CHD risk factors. Marland Kitchen LDL-C is now calculated using the Martin-Hopkins  calculation, which is a validated novel method providing  better accuracy than the Friedewald equation in the  estimation of LDL-C.  Cresenciano Genre et al. Annamaria Helling. 3570;177(93): 2061-2068  (http://education.[facility].com/faq/FAQ164)   . Total CHOL/HDL Ratio 12/16/2017 5.8* <5.0 (calc) Final  . Non-HDL Cholesterol (Calc) 12/16/2017 152* <130 mg/dL (calc) Final   Comment: For  patients with diabetes plus 1 major ASCVD risk  factor, treating to a non-HDL-C goal of <100 mg/dL  (LDL-C of <70 mg/dL) is considered a therapeutic  option.   . WBC 12/16/2017 9.3  3.8 - 10.8 Thousand/uL Final  . RBC 12/16/2017 4.88  4.20 - 5.80 Million/uL Final  . Hemoglobin 12/16/2017 14.2  13.2 - 17.1 g/dL Final  . HCT 12/16/2017 41.1  38.5 - 50.0 % Final  . MCV 12/16/2017 84.2  80.0 - 100.0 fL Final  . MCH 12/16/2017 29.1  27.0 - 33.0 pg Final  . MCHC 12/16/2017 34.5  32.0 - 36.0 g/dL Final  . RDW 12/16/2017 13.5  11.0 - 15.0 % Final  . Platelets 12/16/2017 352  140 - 400 Thousand/uL Final  . MPV 12/16/2017 9.5  7.5 - 12.5 fL Final  . Neutro Abs 12/16/2017 6,631  1,500 - 7,800 cells/uL Final  . Lymphs Abs 12/16/2017 1,721  850 - 3,900 cells/uL Final  . WBC mixed population 12/16/2017 735  200 - 950 cells/uL Final  . Eosinophils Absolute 12/16/2017 167  15 - 500 cells/uL Final  . Basophils Absolute 12/16/2017 47  0 - 200 cells/uL Final  . Neutrophils Relative % 12/16/2017 71.3  % Final  . Total Lymphocyte 12/16/2017 18.5  % Final  . Monocytes Relative 12/16/2017 7.9  % Final  . Eosinophils Relative 12/16/2017 1.8  % Final  . Basophils Relative 12/16/2017 0.5  % Final  . PSA 12/16/2017 0.7  < OR = 4.0 ng/mL Final   Comment: The total PSA value from this assay system is  standardized against the WHO standard. The test  result will be approximately 20% lower when compared  to the equimolar-standardized total PSA (Beckman  Coulter). Comparison of serial PSA results should be  interpreted with this fact in mind. . This test was performed using the Siemens  chemiluminescent method. Values obtained from  different assay methods cannot be used interchangeably. PSA levels, regardless of value, should not be interpreted as absolute evidence of the presence or absence of disease.   . Hepatitis C Ab 12/16/2017 NON-REACTIVE  NON-REACTI Final  . SIGNAL TO CUT-OFF 12/16/2017 0.02  <1.00  Final   Comment: . HCV antibody was non-reactive. There is no laboratory  evidence of HCV infection. . In most cases, no further action is required. However, if recent HCV exposure is suspected, a test for HCV RNA (test code [id number]) is suggested. . For additional  information please refer to http://education.[facility].com/faq/FAQ22v1 (This link is being provided for informational/ educational purposes only.) .         Review of Systems  Constitutional: Positive for weight gain.       He has gradually gained some weight  HENT: Negative for headaches.   Eyes: Negative for blurred vision.  Respiratory: Negative for daytime sleepiness.   Cardiovascular: Negative for palpitations.  Gastrointestinal: Negative for diarrhea.  Endocrine: Negative for fatigue.  Genitourinary: Negative for frequency.  Musculoskeletal: Negative for joint pain.  Skin: Negative for rash.  Neurological: Negative for weakness.  Psychiatric/Behavioral: Negative for insomnia.     PHYSICAL EXAM:  BP (!) 142/92   Pulse 70   Ht _0  (1.753 m)   Wt 251 lb (113.9 kg)   SpO2 97%   BMI 37.07 kg/m   GENERAL: Mild generalized obesity present  No pallor, clubbing, lymphadenopathy or edema.  Skin:  no rash or pigmentation.  EYES:  Externally normal.  Fundii:  normal discs and vessels.  ENT: Oral mucosa and tongue normal.  THYROID:  Not palpable.  HEART:  Normal  S1 and S2; no murmur or click.  CHEST:  Normal shape.  Lungs: Vescicular breath sounds heard equally.  No crepitations/ wheeze.  ABDOMEN:  No distention.  Liver and spleen not palpable.  No other mass or tenderness. No visual artery bruit present  NEUROLOGICAL: .Reflexes are bilaterally normal at biceps.  JOINTS:  Normal.   ASSESSMENT:    Long-standing history of hypertension which appears to be inadequately controlled and currently taking metoprolol and amlodipine  Long-standing hypokalemia not related to diuretics, GI  fluid loss or other obvious causes indicating possible renal potassium loss. 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

## 2018-04-03 ENCOUNTER — Other Ambulatory Visit (INDEPENDENT_AMBULATORY_CARE_PROVIDER_SITE_OTHER): Payer: 59

## 2018-04-03 DIAGNOSIS — E876 Hypokalemia: Secondary | ICD-10-CM | POA: Diagnosis not present

## 2018-04-03 DIAGNOSIS — I159 Secondary hypertension, unspecified: Secondary | ICD-10-CM | POA: Diagnosis not present

## 2018-04-03 LAB — RENAL FUNCTION PANEL
ALBUMIN: 4.2 g/dL (ref 3.5–5.2)
BUN: 16 mg/dL (ref 6–23)
CO2: 33 mEq/L — ABNORMAL HIGH (ref 19–32)
Calcium: 8.9 mg/dL (ref 8.4–10.5)
Chloride: 98 mEq/L (ref 96–112)
Creatinine, Ser: 0.92 mg/dL (ref 0.40–1.50)
GFR: 90.79 mL/min (ref >60.00)
GLUCOSE: 142 mg/dL — AB (ref 70–99)
PHOSPHORUS: 3 mg/dL (ref 2.3–4.6)
POTASSIUM: 3.5 meq/L (ref 3.5–5.1)
SODIUM: 140 meq/L (ref 135–145)

## 2018-04-06 LAB — ALDOSTERONE + RENIN ACTIVITY W/ RATIO
ALDOS/RENIN RATIO: 25.2 (ref 0.0–30.0)
ALDOSTERONE: 14.6 ng/dL (ref 0.0–30.0)
Renin: 0.579 ng/mL/hr (ref 0.167–5.380)

## 2018-06-02 ENCOUNTER — Telehealth: Payer: Self-pay | Admitting: Endocrinology

## 2018-06-02 NOTE — Telephone Encounter (Signed)
error 

## 2018-06-02 NOTE — Telephone Encounter (Signed)
Patient returning call regarding labs

## 2018-06-06 ENCOUNTER — Other Ambulatory Visit: Payer: Self-pay

## 2018-06-06 NOTE — Telephone Encounter (Signed)
Dr. Dwyane Dee has decided he wants patient to come in and have BMP drawn again to check potassium- LVM requesting patient call and schedule lab visit

## 2018-06-06 NOTE — Telephone Encounter (Signed)
Spoke with the patient and he will come in to pick up jug for 24 hour urine

## 2018-06-08 ENCOUNTER — Telehealth: Payer: Self-pay | Admitting: Endocrinology

## 2018-06-08 DIAGNOSIS — E876 Hypokalemia: Secondary | ICD-10-CM

## 2018-06-08 NOTE — Telephone Encounter (Signed)
Please have patient come in for labs to check for potassium and determine if he needs a potassium prescription before doing the 24-hour urine tests

## 2018-06-09 ENCOUNTER — Telehealth: Payer: Self-pay

## 2018-06-09 ENCOUNTER — Other Ambulatory Visit: Payer: Self-pay

## 2018-06-09 ENCOUNTER — Other Ambulatory Visit (INDEPENDENT_AMBULATORY_CARE_PROVIDER_SITE_OTHER): Payer: 59

## 2018-06-09 DIAGNOSIS — E876 Hypokalemia: Secondary | ICD-10-CM | POA: Diagnosis not present

## 2018-06-09 LAB — BASIC METABOLIC PANEL
BUN: 22 mg/dL (ref 6–23)
CALCIUM: 9 mg/dL (ref 8.4–10.5)
CO2: 34 meq/L — AB (ref 19–32)
CREATININE: 1.18 mg/dL (ref 0.40–1.50)
Chloride: 101 mEq/L (ref 96–112)
GFR: 68.08 mL/min (ref >60.00)
Glucose, Bld: 139 mg/dL — ABNORMAL HIGH (ref 70–99)
Potassium: 2.9 mEq/L — ABNORMAL LOW (ref 3.5–5.1)
SODIUM: 143 meq/L (ref 135–145)

## 2018-06-09 MED ORDER — POTASSIUM CHLORIDE CRYS ER 10 MEQ PO TBCR: EXTENDED_RELEASE_TABLET | ORAL | 1 refills | Status: DC

## 2018-06-09 NOTE — Telephone Encounter (Signed)
He is scheduled!

## 2018-06-09 NOTE — Telephone Encounter (Signed)
Patient is coming in today for labs

## 2018-06-09 NOTE — Telephone Encounter (Signed)
Please see my other telephone note.  He is supposed to come in to get his potassium checked before he does this

## 2018-06-09 NOTE — Telephone Encounter (Signed)
Gave patient results from 04/07/18 with instructions to do 24 hour urine- patient agreed and will pick up salt tablets and will come to the office to get 24 hour urine jug

## 2018-06-11 MED ORDER — POTASSIUM CHLORIDE CRYS ER 20 MEQ PO TBCR: 20.0000 meq | EXTENDED_RELEASE_TABLET | Freq: Two times a day (BID) | ORAL | 3 refills | Status: DC

## 2018-06-11 NOTE — Telephone Encounter (Signed)
Potassium level is very low, prescription has been sent for K-Dur 20 milliequivalents twice a day which he will take for 10 days before doing the urine test with salt loading.  Please schedule follow-up appointment for about a month with repeat BMP lab

## 2018-06-12 NOTE — Telephone Encounter (Signed)
He will do the urine test after 10 days of potassium

## 2018-06-12 NOTE — Telephone Encounter (Signed)
Notified need to do urine test after 10 days of Rx. K-dur and f/u 1 month lab appt...the patient understood

## 2018-06-12 NOTE — Telephone Encounter (Signed)
Pt questioning after taking the Rx K-dur for 10 days does pt need urine test right away or wait 1 month with the bmp lab...please advise.

## 2018-06-26 ENCOUNTER — Other Ambulatory Visit (INDEPENDENT_AMBULATORY_CARE_PROVIDER_SITE_OTHER): Payer: 59

## 2018-06-26 ENCOUNTER — Other Ambulatory Visit: Payer: Self-pay | Admitting: Endocrinology

## 2018-06-26 DIAGNOSIS — I159 Secondary hypertension, unspecified: Secondary | ICD-10-CM

## 2018-06-26 LAB — POTASSIUM: Potassium: 3.2 mEq/L — ABNORMAL LOW (ref 3.5–5.1)

## 2018-06-27 ENCOUNTER — Other Ambulatory Visit: Payer: Self-pay

## 2018-06-27 MED ORDER — POTASSIUM CHLORIDE CRYS ER 20 MEQ PO TBCR: EXTENDED_RELEASE_TABLET | ORAL | 3 refills | Status: DC

## 2018-06-28 ENCOUNTER — Other Ambulatory Visit: Payer: 59

## 2018-06-28 DIAGNOSIS — I159 Secondary hypertension, unspecified: Secondary | ICD-10-CM

## 2018-06-30 ENCOUNTER — Other Ambulatory Visit: Payer: Self-pay

## 2018-07-06 LAB — ALDOSTERONE, URINE
Aldosterone U,Random: 4.15 ug/L
Aldosterone, 24H Ur: 11.83 ug/24 hr (ref 0.00–19.00)

## 2018-07-06 LAB — CREATININE, URINE, 24 HOUR
CREATININE, UR: 47.3 mg/dL
Creatinine, 24H Ur: 1348 mg/24 hr (ref 1000–2000)

## 2018-08-07 NOTE — Progress Notes (Signed)
Patient ID: Cody Fox, male   DOB: 1960-10-02, 58 y.o.   MRN: [id number]          Referring physician: Belinda Fisher  Chief complaint: Low potassium blood pressure follow-up  History of Present Illness:  HYPOKALEMIA/hypertension:  Patient states that he has had high blood pressure possibly since he was 58 years old Blood pressure has been usually difficult to control For several years he has had a low potassium level which has not been supplemented consistently  History does not have any symptoms of muscle cramps or muscle weakness and has no history of cardiac arrhythmias Potassium has been as low as 2.9  His blood pressure has been generally follow-up with PCP More recently he thinks that he is trying to cut back on meats, fast food hamburgers and eat more fruits and vegetables but still eating fast food biscuits in the morning He also do not restrict salt intake He thinks his blood pressure is recently somewhat better   Home BP 189/110 highest, more recently 140-150/83-90  BP Readings from Last 3 Encounters:  08/08/18 140/84  03/31/18 (!) 142/92  12/16/17 134/68   Testing: He has been evaluated for hyperaldosteronism  Although his aldosterone level in September was only 14.6 his aldosterone/renin ratio was 25. This was still not diagnostic of hyperaldosteronism  He was told to do salt loading with his urine aldosterone collection but he did not add any extra salt or use salt tablets However his 24-hour urine sodium was 11.8 only below the cutoff of 12  He has had a CT scan of his abdomen in 2010 which  did not show an adrenal abnormality  Lab Results  Component Value Date   K 3.2 (L) 06/26/2018     Past Medical History:  Diagnosis Date  . Hypertension   . Kidney stone   . 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 kidney stone retrieval     via urethra    Family History  Problem  Relation Age of Onset  . Hypertension Father   . Hypertension Paternal Uncle   . Coronary artery disease Neg Hx   . Cancer Neg Hx        prostate or colon  . Diabetes Neg Hx   . Colon cancer Neg Hx   . Stroke Neg Hx     Social History:  reports that he quit smoking about 25 years ago. He has never used smokeless tobacco. He reports current alcohol use. He reports that he does not use drugs.  Allergies: No Known Allergies  Allergies as of 08/08/2018   No Known Allergies     Medication List       Accurate as of August 08, 2018  2:42 PM. Always use your most recent med list.        amLODipine 10 MG tablet Commonly known as:  NORVASC Take 1 tablet (10 mg total) by mouth daily.   metoprolol succinate 100 MG 24 hr tablet Commonly known as:  TOPROL-XL Take 1 tablet (100 mg total) by mouth daily. Take with or immediately following a meal   potassium chloride SA 20 MEQ tablet Commonly known as:  K-DUR,KLOR-CON Take 1 tablet 3 times daily with a meal       LABS:  No visits with results within 1 Week(s) from this visit.  Latest known visit with results is:  Lab on 06/28/2018  Component Date Value  Ref Range Status  . Aldosterone U,Random 06/28/2018 4.15  Not Estab. ug/L Final   Comment: This test was developed and its performance characteristics determined by [facility]. It has not been cleared or approved by the Food and Drug Administration.   . Aldosterone, 24H Ur 06/28/2018 11.83  0.00 - 19.00 ug/24 hr Final   Comment:                                  Adult Ranges                     Low Sodium Intake     20.00 - 80.00                     Normal Sodium Intake   0.00 - 19.00                     High Sodium Intake     0.00 - 12.00   . Creatinine, Urine 06/28/2018 47.3  Not Estab. mg/dL Final  . 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 or other clinical cause Also asymptomatic  His evaluation for hyperaldosteronism has not been diagnostic with the urine 24-hour collection which was below the threshold of 12 even without any salt loading and makes it unlikely that he has primary hyperaldosteronism Also does not have a adrenal adenoma on the CT scan done in 2010   PLAN:   Most likely can empirically try him on Aldactone rather than repeat the salt loading and urine collection for now May consider evaluation only if his blood pressure is consistently difficult to control and Aldactone is not able to control hypertension and hypokalemia We will need to recheck his potassium level today and to decide on doses of Aldactone Encouraged him to cut back on fast food biscuits and continue healthier low-sodium low-fat diet with exercise  Total visit time for evaluation of detailed history, prior lab results and radiology as well as discussion with patient and counseling =25 minutes  Latasia Silberstein 08/08/2018, 2:42 PM

## 2018-08-08 ENCOUNTER — Encounter: Payer: Self-pay | Admitting: Endocrinology

## 2018-08-08 ENCOUNTER — Ambulatory Visit (INDEPENDENT_AMBULATORY_CARE_PROVIDER_SITE_OTHER): Payer: 59 | Admitting: Endocrinology

## 2018-08-08 VITALS — BP 140/84 | HR 73 | Ht 69.0 in | Wt 251.8 lb

## 2018-08-08 DIAGNOSIS — I159 Secondary hypertension, unspecified: Secondary | ICD-10-CM

## 2018-08-08 DIAGNOSIS — Z131 Encounter for screening for diabetes mellitus: Secondary | ICD-10-CM

## 2018-08-08 DIAGNOSIS — E876 Hypokalemia: Secondary | ICD-10-CM | POA: Diagnosis not present

## 2018-08-08 LAB — BASIC METABOLIC PANEL
BUN: 20 mg/dL (ref 6–23)
CHLORIDE: 99 meq/L (ref 96–112)
CO2: 31 mEq/L (ref 19–32)
CREATININE: 1.15 mg/dL (ref 0.40–1.50)
Calcium: 9.2 mg/dL (ref 8.4–10.5)
GFR: 70.09 mL/min (ref >60.00)
Glucose, Bld: 100 mg/dL — ABNORMAL HIGH (ref 70–99)
Potassium: 3.8 mEq/L (ref 3.5–5.1)
Sodium: 139 mEq/L (ref 135–145)

## 2018-08-08 LAB — HEMOGLOBIN A1C: Hgb A1c MFr Bld: 6.8 % — ABNORMAL HIGH (ref 4.6–6.5)

## 2018-08-09 ENCOUNTER — Other Ambulatory Visit: Payer: Self-pay

## 2018-08-09 MED ORDER — SPIRONOLACTONE 50 MG PO TABS: 50.0000 mg | ORAL_TABLET | Freq: Every day | ORAL | 4 refills | Status: DC

## 2018-08-10 ENCOUNTER — Other Ambulatory Visit: Payer: Self-pay | Admitting: Endocrinology

## 2018-08-10 DIAGNOSIS — E1169 Type 2 diabetes mellitus with other specified complication: Secondary | ICD-10-CM

## 2018-08-10 DIAGNOSIS — E669 Obesity, unspecified: Principal | ICD-10-CM

## 2018-08-14 ENCOUNTER — Ambulatory Visit: Payer: 59 | Admitting: Endocrinology

## 2018-08-14 ENCOUNTER — Telehealth: Payer: Self-pay | Admitting: Dietician

## 2018-08-15 NOTE — Telephone Encounter (Signed)
Patient referral for meal planning for newly diagnosed type 2 diabetes, monitoring with one touch verio and a low sodium diet for HTN.   Called patient and set appointment for 09/14/2018 at 3:30.   He has an appointment with Dr. Dwyane Dee 09/11/2018 at 3:45.  [name], RD, LDN, CDE

## 2018-09-11 ENCOUNTER — Ambulatory Visit: Payer: 59 | Admitting: Endocrinology

## 2018-09-14 ENCOUNTER — Encounter: Payer: 59 | Attending: Endocrinology | Admitting: Dietician

## 2018-09-14 ENCOUNTER — Encounter: Payer: Self-pay | Admitting: Dietician

## 2018-09-14 DIAGNOSIS — E119 Type 2 diabetes mellitus without complications: Secondary | ICD-10-CM | POA: Diagnosis present

## 2018-09-14 DIAGNOSIS — I10 Essential (primary) hypertension: Secondary | ICD-10-CM | POA: Diagnosis not present

## 2018-09-14 NOTE — Patient Instructions (Signed)
Find ways to be active most days.  Aim for 30 minutes most days. Read your labels for fat, sodium, carbohydrate. Be mindful about choices when eating out.  Ask for foods to be prepared without salt. Bake rather than fried Choose water or other unsweetened beverages Be mindful about portion sizes of bread and other starchy foods. 1/2 your plate should be vegetables  Aim for 3-4 Carb Choices per meal (45-60 grams) +/- 1 either way  Aim for 0-1 Carbs per snack if hungry  Include protein in moderation with your meals and snacks Consider reading food labels for Total Carbohydrate and Fat Grams of foods Consider checking BG at alternate times per day as directed by MD

## 2018-09-14 NOTE — Progress Notes (Signed)
Diabetes Self-Management Education  Visit Type: First/Initial  Appt. Start Time: 1500 Appt. End Time: 4287  09/15/2018  [name], identified by name and date of birth, is a 58 y.o. male with a diagnosis of Diabetes: Type 2.   ASSESSMENT Patient is here today alone.  "I would rather be on a starving diet that have diabetes." He states that he was borderline for DM for the past 17 years. Other history includes:  HTN, macular degeneration, kidney stones, and newly diagnosed diabetes. He sleeps 6 hours, feels well rested and is unable to sleep more. Medications include:  Aldactone and vitamin D.  He also was taking NaCl tablets which he was just to take during a urine aldosterone collection.  Discussed with Dr. Dwyane Dee and called patient to tell him to quit taking these.  Weight hx: 250 lbs today which is his highest weight- He states that he has gone through a phase of not caring and decreased motivation. 189 lbs when he was in the [employer]  Provided patient with a One Touch Verio Meter and instructed him on it's use.  Lot [id number] x, Expiration 03/26/2019.  His BG was 89.  Patient lives alone.  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  His family is from [place].  He is not exercising because of work stress and is too fatigued.  He states that he walks a lot at work. - about 3,000 steps daily.  His dog passed away about 1 year ago and has walked less.  Avoids processed meats, soda.  Does drink a peach tea once per week. Enjoys Danishes, french baguettes, and croissants daily His diet is very high in sodium due to eating out.  Height 5\' 9"  (1.753 m), weight 250 lb (113.4 kg). Body mass index is 36.92 kg/m.  Diabetes Self-Management Education - 09/14/18 1514      Visit Information   Visit Type  First/Initial      Initial Visit   Diabetes Type  Type 2    Are you currently following a meal plan?  No    Are you taking your medications as prescribed?  Yes    Date  Diagnosed  08/08/2018      Health Coping   How would you rate your overall health?  Good      Psychosocial Assessment   Patient Belief/Attitude about Diabetes  Denial    Self-care barriers  None    Self-management support  Doctor's office    Other persons present  Patient    Patient Concerns  Nutrition/Meal planning;Glycemic Control;Weight Control;Healthy Lifestyle;Monitoring    Special Needs  None    Preferred Learning Style  No preference indicated    Learning Readiness  Ready    How often do you need to have someone help you when you read instructions, pamphlets, or other written materials from your doctor or pharmacy?  1 - Never    What is the last grade level you completed in school?  2 years college      Pre-Education Assessment   Patient understands the diabetes disease and treatment process.  Needs Instruction    Patient understands incorporating nutritional management into lifestyle.  Needs Instruction    Patient undertands incorporating physical activity into lifestyle.  Needs Instruction    Patient understands using medications safely.  Needs Instruction    Patient understands monitoring blood glucose, interpreting and using results  Needs Instruction    Patient understands prevention, detection, and treatment of acute complications.  Needs Instruction    Patient understands prevention, detection, and treatment of chronic complications.  Needs Instruction    Patient understands how to develop strategies to address psychosocial issues.  Needs Instruction    Patient understands how to develop strategies to promote health/change behavior.  Needs Instruction      Complications   Last HgB A1C per patient/outside source  6.8 %   08/08/2018   How often do you check your blood sugar?  0 times/day (not testing)    Have you had a dilated eye exam in the past 12 months?  Yes    Have you had a dental exam in the past 12 months?  Yes    Are you checking your feet?  Yes    How many days  per week are you checking your feet?  7      Dietary Intake   Breakfast  croissant, black coffee    Snack (morning)  none    Lunch  Japanese (rice, chicken, vegetables)    Snack (afternoon)  dried cranberries    Dinner  soup, french bread, cheese or smoothie (cucumber, water, plain whole milk yogurt, 1 clove garlic), french bread AND danish    Snack (evening)  fruit (fresh)    Beverage(s)  water, black coffee, unsweetened tea, Mango iced tea at [employer],       Exercise   Exercise Type  Light (walking / raking leaves)   at work     Patient Education   Previous Diabetes Education  No    Disease state   Definition of diabetes, type 1 and 2, and the diagnosis of diabetes;Factors that contribute to the development of diabetes;Explored patient's options for treatment of their diabetes    Nutrition management   Role of diet in the treatment of diabetes and the relationship between the three main macronutrients and blood glucose level;Information on hints to eating out and maintain blood glucose control.;Meal options for control of blood glucose level and chronic complications.;Food label reading, portion sizes and measuring food.;Effects of alcohol on blood glucose and safety factors with consumption of alcohol.    Physical activity and exercise   Role of exercise on diabetes management, blood pressure control and cardiac health.    Monitoring  Purpose and frequency of SMBG.;Identified appropriate SMBG and/or A1C goals.;Daily foot exams;Yearly dilated eye exam    Acute complications  Taught treatment of hypoglycemia - the 15 rule.    Chronic complications  Relationship between chronic complications and blood glucose control;Dental care    Psychosocial adjustment  Worked with patient to identify barriers to care and solutions;Role of stress on diabetes;Identified and addressed patients feelings and concerns about diabetes    Personal strategies to promote health  Lifestyle issues that need to be  addressed for better diabetes care      Individualized Goals (developed by patient)   Nutrition  General guidelines for healthy choices and portions discussed   Low sodium as well   Physical Activity  Exercise 5-7 days per week;30 minutes per day    Medications  take my medication as prescribed    Monitoring   test my blood glucose as discussed    Problem Solving  decrease eating out    Reducing Risk  increase portions of healthy fats    Health Coping  discuss diabetes with (comment)   MD, RD, CDE     Post-Education Assessment   Patient understands the diabetes disease and treatment process.  Demonstrates understanding / competency  Patient understands incorporating nutritional management into lifestyle.  Needs Review    Patient undertands incorporating physical activity into lifestyle.  Demonstrates understanding / competency    Patient understands using medications safely.  Demonstrates understanding / competency    Patient understands monitoring blood glucose, interpreting and using results  Demonstrates understanding / competency    Patient understands prevention, detection, and treatment of acute complications.  Demonstrates understanding / competency    Patient understands prevention, detection, and treatment of chronic complications.  Demonstrates understanding / competency    Patient understands how to develop strategies to address psychosocial issues.  Demonstrates understanding / competency    Patient understands how to develop strategies to promote health/change behavior.  Needs Review      Outcomes   Expected Outcomes  Demonstrated interest in learning. Expect positive outcomes    Future DMSE  2 months    Program Status  Completed       Individualized Plan for Diabetes Self-Management Training:   Learning Objective:  Patient will have a greater understanding of diabetes self-management. Patient education plan is to attend individual and/or group sessions per assessed  needs and concerns.   Plan:   Patient Instructions  Find ways to be active most days.  Aim for 30 minutes most days. Read your labels for fat, sodium, carbohydrate. Be mindful about choices when eating out.  Ask for foods to be prepared without salt. Bake rather than fried Choose water or other unsweetened beverages Be mindful about portion sizes of bread and other starchy foods. 1/2 your plate should be vegetables  Aim for 3-4 Carb Choices per meal (45-60 grams) +/- 1 either way  Aim for 0-1 Carbs per snack if hungry  Include protein in moderation with your meals and snacks Consider reading food labels for Total Carbohydrate and Fat Grams of foods Consider checking BG at alternate times per day as directed by MD   Expected Outcomes:  Demonstrated interest in learning. Expect positive outcomes  Education material provided: ADA Diabetes: Your Take Control Guide, Food label handouts, A1C conversion sheet, Meal plan card and Snack sheet, DASH diet, Mediterranean diet, Low sodium nutrition therapy, Calorie [employer] Pace app,   If problems or questions, patient to contact team via:  Phone  Future DSME appointment: 2 months

## 2018-09-15 ENCOUNTER — Telehealth: Payer: Self-pay | Admitting: Dietician

## 2018-09-15 ENCOUNTER — Other Ambulatory Visit: Payer: Self-pay

## 2018-09-15 NOTE — Telephone Encounter (Signed)
Called patient to tell him to stop the Sodium Chloride tablets per discussion with Dr. Dwyane Dee. Patient verbalized understanding. He is to call for any questions.  [name], RD, LDN, CDE

## 2018-09-18 ENCOUNTER — Other Ambulatory Visit: Payer: Self-pay

## 2018-09-18 MED ORDER — GLUCOSE BLOOD VI STRP: 1.0000 | ORAL_STRIP | Freq: Two times a day (BID) | 3 refills | Status: DC

## 2018-09-18 MED ORDER — ONETOUCH DELICA PLUS LANCING MISC: 1.0000 | Freq: Two times a day (BID) | 3 refills | Status: DC

## 2018-09-19 ENCOUNTER — Other Ambulatory Visit: Payer: Self-pay

## 2018-09-19 ENCOUNTER — Telehealth: Payer: Self-pay | Admitting: Endocrinology

## 2018-09-19 MED ORDER — ACCU-CHEK FASTCLIX LANCETS MISC: 3 refills | Status: DC

## 2018-09-19 MED ORDER — GLUCOSE BLOOD VI STRP: ORAL_STRIP | 3 refills | Status: DC

## 2018-09-19 MED ORDER — ACCU-CHEK GUIDE ME W/DEVICE KIT: 1.0000 | PACK | Freq: Two times a day (BID) | 0 refills | Status: AC

## 2018-09-19 NOTE — Telephone Encounter (Signed)
Rx sent 

## 2018-09-19 NOTE — Telephone Encounter (Signed)
Patient stated that his insurance wants him to use the ACCU CHECK test strips because the strips he has been using are no longer covered,  He is needing a new prescription sent into his pharmacy for these, the patient is completely out of strips and unable to test his sugars     [id number] - [address], [place]

## 2018-09-22 ENCOUNTER — Other Ambulatory Visit: Payer: Self-pay

## 2018-11-02 ENCOUNTER — Ambulatory Visit: Payer: 59 | Admitting: Endocrinology

## 2018-11-03 ENCOUNTER — Other Ambulatory Visit: Payer: Self-pay | Admitting: Internal Medicine

## 2018-11-10 ENCOUNTER — Encounter: Payer: 59 | Admitting: Endocrinology

## 2018-11-10 ENCOUNTER — Other Ambulatory Visit: Payer: Self-pay | Admitting: Endocrinology

## 2018-11-10 ENCOUNTER — Other Ambulatory Visit: Payer: Self-pay

## 2018-11-10 ENCOUNTER — Encounter: Payer: Self-pay | Admitting: Endocrinology

## 2018-11-10 ENCOUNTER — Ambulatory Visit: Payer: 59 | Admitting: Dietician

## 2018-11-10 ENCOUNTER — Other Ambulatory Visit (INDEPENDENT_AMBULATORY_CARE_PROVIDER_SITE_OTHER): Payer: 59

## 2018-11-10 DIAGNOSIS — E1169 Type 2 diabetes mellitus with other specified complication: Secondary | ICD-10-CM | POA: Diagnosis not present

## 2018-11-10 DIAGNOSIS — E669 Obesity, unspecified: Principal | ICD-10-CM

## 2018-11-10 LAB — LIPID PANEL
Cholesterol: 144 mg/dL (ref 0–200)
HDL: 28.2 mg/dL — ABNORMAL LOW (ref >39.00)
LDL Cholesterol: 89 mg/dL (ref 0–99)
NonHDL: 115.67
Total CHOL/HDL Ratio: 5
Triglycerides: 134 mg/dL (ref 0.0–149.0)
VLDL: 26.8 mg/dL (ref 0.0–40.0)

## 2018-11-10 LAB — COMPREHENSIVE METABOLIC PANEL
ALT: 12 U/L (ref 0–53)
AST: 12 U/L (ref 0–37)
Albumin: 4.6 g/dL (ref 3.5–5.2)
Alkaline Phosphatase: 55 U/L (ref 39–117)
BUN: 27 mg/dL — ABNORMAL HIGH (ref 6–23)
CO2: 29 mEq/L (ref 19–32)
Calcium: 9.6 mg/dL (ref 8.4–10.5)
Chloride: 102 mEq/L (ref 96–112)
Creatinine, Ser: 1.22 mg/dL (ref 0.40–1.50)
GFR: 61.54 mL/min (ref >60.00)
Glucose, Bld: 94 mg/dL (ref 70–99)
Potassium: 4.8 mEq/L (ref 3.5–5.1)
Sodium: 138 mEq/L (ref 135–145)
Total Bilirubin: 0.5 mg/dL (ref 0.2–1.2)
Total Protein: 7.2 g/dL (ref 6.0–8.3)

## 2018-11-11 NOTE — Progress Notes (Signed)
This encounter was created in error - please disregard.

## 2018-11-13 LAB — HEMOGLOBIN A1C: Hgb A1c MFr Bld: 5.9 % (ref 4.6–6.5)

## 2018-11-14 ENCOUNTER — Ambulatory Visit (INDEPENDENT_AMBULATORY_CARE_PROVIDER_SITE_OTHER): Payer: 59 | Admitting: Endocrinology

## 2018-11-14 ENCOUNTER — Encounter: Payer: Self-pay | Admitting: Endocrinology

## 2018-11-14 ENCOUNTER — Other Ambulatory Visit: Payer: Self-pay

## 2018-11-14 DIAGNOSIS — E669 Obesity, unspecified: Secondary | ICD-10-CM | POA: Diagnosis not present

## 2018-11-14 DIAGNOSIS — E1169 Type 2 diabetes mellitus with other specified complication: Secondary | ICD-10-CM

## 2018-11-14 DIAGNOSIS — I159 Secondary hypertension, unspecified: Secondary | ICD-10-CM

## 2018-11-14 DIAGNOSIS — E876 Hypokalemia: Secondary | ICD-10-CM | POA: Diagnosis not present

## 2018-11-14 NOTE — Progress Notes (Signed)
Patient ID: Cody Fox, male   DOB: [birth year], 58 y.o.   MRN: [id number]          Referring physician: Belinda Fisher   Today's office visit was provided via telemedicine using video technique Explained to the patient and the the limitations of evaluation and management by telemedicine and the availability of in person appointments.  The patient understood the limitations and agreed to proceed. Patient also understood that the telehealth visit is billable. . Location of the patient: Home . Location of the provider: Office Only the patient and myself were participating in the encounter   Chief complaint: Low potassium & blood pressure follow-up  History of Present Illness:  HYPOKALEMIA/hypertension:  Patient states that he has had high blood pressure possibly since he was 58 years old Blood pressure has been usually difficult to control For several years he has had a low potassium level which has not been supplemented consistently  History does not have any symptoms of muscle cramps or muscle weakness and has no history of cardiac arrhythmias Potassium has been as low as 2.9  Home BP has been as high as 189/110 previously LAB testing: He has been evaluated for hyperaldosteronism  Although his aldosterone level in September 2019 was only 14.6 his aldosterone/renin ratio was 25. This was still not diagnostic of hyperaldosteronism He was told to do salt loading with his urine aldosterone collection but he did not add any extra salt or use salt tablets However his 24-hour urine aldosterone was 11.8 only below the cutoff of 12 He has had a CT scan of his abdomen in 2010 which  did not show an adrenal abnormality  Recent HISTORY Because of his hypokalemia he was started on ALDACTONE 50 mg daily in 1/20 His amlodipine 10 mg and metoprolol were continued unchanged  He monitors his blood pressure regularly at home He says his home blood pressure is generally fairly good with systolic  readings in the 130s usually and diastolic reading either upper 70s or low 80s.  Recent reading 138/83  Potassium is also improved, currently not taking any salt substitutes, eating a lot of bananas oranges  Lab Results  Component Value Date   K 4.8 11/10/2018   DIABETES:  He has had mild diabetes with highest A1c 6.8 He was started on home glucose monitoring and has seen the dietitian He has lost about 40 pounds with recent weight about 210  He has made significant changes in his diet with cutting back on carbohydrates and high-fat foods as well as total calories he is also much more regular with his exercise with using a total gym and bicycle  Home blood sugars range from 83-89 before dinner and 91-100 after dinner Blood sugars being done by Accu-Chek meter His A1c is back to normal  Lab Results  Component Value Date   HGBA1C 5.9 11/10/2018   HGBA1C 6.8 (H) 08/08/2018   Lab Results  Component Value Date   LDLCALC 89 11/10/2018   CREATININE 1.22 11/10/2018     Past Medical History:  Diagnosis Date  . Diabetes mellitus without complication ([place])   . Hypertension   . Kidney stone   . 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 kidney stone retrieval     via urethra    Family History  Problem Relation Age of Onset  . Hypertension Father   . Hypertension Paternal Uncle   . Coronary  artery disease Neg Hx   . Cancer Neg Hx        prostate or colon  . Diabetes Neg Hx   . Colon cancer Neg Hx   . Stroke Neg Hx     Social History:  reports that he quit smoking about 25 years ago. He has never used smokeless tobacco. He reports current alcohol use. He reports that he does not use drugs.  Allergies: No Known Allergies  Allergies as of 11/14/2018   No Known Allergies     Medication List       Accurate as of November 14, 2018  1:16 PM. Always use your most recent med list.         Accu-Chek FastClix Lancets Misc Use lancet to check blood sugar twice daily.   Accu-Chek Guide Me w/Device Kit 1 each by Does not apply route 2 (two) times daily. Use meter to check blood sugar 2 times daily.   amLODipine 10 MG tablet Commonly known as:  NORVASC Take 1 tablet (10 mg total) by mouth daily.   cholecalciferol 25 MCG (1000 UT) tablet Commonly known as:  VITAMIN D3 Take 1,000 Units by mouth daily.   glucose blood test strip Commonly known as:  Accu-Chek Guide Use as instructed to check blood sugar 2 times daily.   metoprolol succinate 100 MG 24 hr tablet Commonly known as:  TOPROL-XL Take 1 tablet (100 mg total) by mouth daily. Take with or immediately following a meal.       LABS:  Lab on 11/10/2018  Component Date Value Ref Range Status  . Cholesterol 11/10/2018 144  0 - 200 mg/dL Final   ATP III Classification       Desirable:  < 200 mg/dL               Borderline High:  200 - 239 mg/dL          High:  > = 240 mg/dL  . Triglycerides 11/10/2018 134.0  0.0 - 149.0 mg/dL Final   Normal:  <150 mg/dLBorderline High:  150 - 199 mg/dL  . HDL 11/10/2018 28.20* >39.00 mg/dL Final  . VLDL 11/10/2018 26.8  0.0 - 40.0 mg/dL Final  . LDL Cholesterol 11/10/2018 89  0 - 99 mg/dL Final  . Total CHOL/HDL Ratio 11/10/2018 5   Final                  Men          Women1/2 Average Risk     3.4          3.3Average Risk          5.0          4.42X Average Risk          9.6          7.13X Average Risk          15.0          11.0                      . NonHDL 11/10/2018 115.67   Final   NOTE:  Non-HDL goal should be 30 mg/dL higher than patient's LDL goal (i.e. LDL goal of < 70 mg/dL, would have non-HDL goal of < 100 mg/dL)  . Sodium 11/10/2018 138  135 - 145 mEq/L Final  . Potassium 11/10/2018 4.8  3.5 - 5.1 mEq/L Final  . Chloride 11/10/2018 102  96 - 112 mEq/L  Final  . CO2 11/10/2018 29  19 - 32 mEq/L Final  . Glucose, Bld 11/10/2018 94  70 - 99 mg/dL Final  . BUN 11/10/2018  27* 6 - 23 mg/dL Final  . Creatinine, Ser 11/10/2018 1.22  0.40 - 1.50 mg/dL Final  . Total Bilirubin 11/10/2018 0.5  0.2 - 1.2 mg/dL Final  . Alkaline Phosphatase 11/10/2018 55  39 - 117 U/L Final  . AST 11/10/2018 12  0 - 37 U/L Final  . ALT 11/10/2018 12  0 - 53 U/L Final  . Total Protein 11/10/2018 7.2  6.0 - 8.3 g/dL Final  . Albumin 11/10/2018 4.6  3.5 - 5.2 g/dL Final  . Calcium 11/10/2018 9.6  8.4 - 10.5 mg/dL Final  . GFR 11/10/2018 61.54  >60.00 mL/min Final  . Hgb A1c MFr Bld 11/10/2018 5.9  4.6 - 6.5 % Final   Glycemic Control Guidelines for People with Diabetes:Non Diabetic:  <6%Goal of Therapy: <7%Additional Action Suggested:  >8%         Review of Systems   PHYSICAL EXAM:  There were no vitals taken for this visit.  Exam not indicated  ASSESSMENT:    Long-standing history of hypertension which has been variably controlled on 2 drugs  Long-standing hypokalemia not related to diuretics or other clinical cause Also asymptomatic  His evaluation for hyperaldosteronism has not been diagnostic for hyperaldosteronism with negative urine aldosterone's and CT scan With adding Aldactone his blood pressure appears to be more consistently controlled  Also without any potassium supplements is potassium level is now 4.8  DIABETES: This has been mild and not very well controlled with significant amount of weight loss from lifestyle changes and A1c is 5.9 compared to 6.8 Not on pharmacological treatment   PLAN:   Recheck potassium in 1 month to ensure stability  He can follow-up with PCP for further monitoring  Elayne Snare 11/14/2018, 1:16 PM

## 2018-11-21 ENCOUNTER — Ambulatory Visit: Payer: 59 | Admitting: Dietician

## 2018-12-19 ENCOUNTER — Encounter: Payer: Self-pay | Admitting: Internal Medicine

## 2018-12-19 ENCOUNTER — Other Ambulatory Visit: Payer: Self-pay

## 2018-12-19 ENCOUNTER — Ambulatory Visit (INDEPENDENT_AMBULATORY_CARE_PROVIDER_SITE_OTHER): Payer: 59 | Admitting: Internal Medicine

## 2018-12-19 DIAGNOSIS — Z0000 Encounter for general adult medical examination without abnormal findings: Secondary | ICD-10-CM

## 2018-12-19 DIAGNOSIS — Z1211 Encounter for screening for malignant neoplasm of colon: Secondary | ICD-10-CM

## 2018-12-19 NOTE — Assessment & Plan Note (Addendum)
--  Td 11-2015; rec a flu shot this season --Colon cancer screening: Colonoscopy 09-2014, due for repeat colonoscopy, GI referral entered. --Prostate cancer screening: DRE, PSA within normal 2019, reassess next year --Available labs reviewed, all okay. --Diet and exercise  discussed.  He is doing great.

## 2018-12-19 NOTE — Progress Notes (Signed)
Subjective:    Patient ID: Cody Fox, male    DOB: Apr 29, 1961, 58 y.o.   MRN: [id number]  DOS:  12/19/2018 Type of visit - description: Virtual Visit via Video Note  I connected with@ on 12/20/18 at  3:00 PM EDT by a video enabled telemedicine application and verified that I am speaking with the correct person using two identifiers.   THIS ENCOUNTER IS A VIRTUAL VISIT DUE TO COVID-19 - PATIENT WAS NOT SEEN IN THE OFFICE. PATIENT HAS CONSENTED TO VIRTUAL VISIT / TELEMEDICINE VISIT   Location of patient: home  Location of provider: office  I discussed the limitations of evaluation and management by telemedicine and the availability of in person appointments. The patient expressed understanding and agreed to proceed.  History of Present Illness: Complete physical exam. No major concerns. He is still working, good compliance with social distancing, feels safe at his workplace.   Review of Systems Doing great with diet, has lost more than 40 pounds since the last time I saw him.   Other than above, a 14 point review of systems is negative   Past Medical History:  Diagnosis Date  . Diabetes mellitus without complication ([place])   . Hypertension   . Kidney stone   . 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 kidney stone retrieval     via urethra    Social History   Socioeconomic History  . Marital status: Divorced    Spouse name: Not on file  . Number of children: 0  . Years of education: Not on file  . Highest education level: Not on file  Occupational History  . 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 Financial resource strain: Not on file  . Food insecurity:    Worry: Not on file    Inability: Not on file  . Transportation needs:    Medical: Not on file    Non-medical: Not on file  Tobacco Use  . Smoking status: Former  Smoker    Last attempt to quit: 07/26/1993    Years since quitting: 25.4  . Smokeless tobacco: Never Used  Substance and Sexual Activity  . Alcohol use: Yes    Comment: rarely  . Drug use: No  . Sexual activity: Not on file  Lifestyle  . Physical activity:    Days per week: Not on file    Minutes per session: Not on file  . Stress: Not on file  Relationships  . Social connections:    Talks on phone: Not on file    Gets together: Not on file    Attends religious service: Not on file    Active member of club or organization: Not on file    Attends meetings of clubs or organizations: Not on file    Relationship status: Not on file  . Intimate partner violence:    Fear of current or ex partner: Not on file    Emotionally abused: Not on file    Physically abused: Not on file    Forced sexual activity: Not on file  Other Topics Concern  . Not on file  Social History Narrative   divorced    no children      Family History  Problem Relation Age of Onset  . Hypertension Father   . Hypertension Paternal Uncle   .  Coronary artery disease Neg Hx   . Cancer Neg Hx        prostate or colon  . Diabetes Neg Hx   . Colon cancer Neg Hx   . Stroke Neg Hx      Allergies as of 12/19/2018   No Known Allergies     Medication List       Accurate as of Dec 19, 2018 11:59 PM. If you have any questions, ask your nurse or doctor.        Accu-Chek FastClix Lancets Misc Use lancet to check blood sugar twice daily.   Accu-Chek Guide Me w/Device Kit 1 each by Does not apply route 2 (two) times daily. Use meter to check blood sugar 2 times daily.   amLODipine 10 MG tablet Commonly known as:  NORVASC Take 1 tablet (10 mg total) by mouth daily.   cholecalciferol 25 MCG (1000 UT) tablet Commonly known as:  VITAMIN D3 Take 1,000 Units by mouth daily.   glucose blood test strip Commonly known as:  Accu-Chek Guide Use as instructed to check blood sugar 2 times daily.   metoprolol  succinate 100 MG 24 hr tablet Commonly known as:  TOPROL-XL Take 1 tablet (100 mg total) by mouth daily. Take with or immediately following a meal.   spironolactone 50 MG tablet Commonly known as:  ALDACTONE Take 1 tablet (50 mg total) by mouth daily. TAKE 1 TABLET BY MOUTH ONCE DAILY.           Objective:   Physical Exam There were no vitals taken for this visit. This is a virtual video visit.  Alert oriented x3, no apparent distress    Assessment     Assessment DM HTN Macular degeneration 2012 Kidney stones  PLAN: DM: Diagnosed with diabetes 07-2018, A1c was 6.8, lost more than 40 pounds, A1c April 2020: 5.7.  Praised! HTN: Currently on metoprolol, amlodipine and spironolactone, ambulatory BP usually 130/81, from time to time they go up to 150.  Recommend to continue monitoring BPs, as long as the majority of readings are normal no change. Hypokalemia: Was evaluated by endocrinology, eval for hyperaldosteronism was nondiagnostic with negative urine aldosterone and CAT scans. Was started on Spironolactone, last BMP satisfactory. RTC 1 year for physical exam   I discussed the assessment and treatment plan with the patient. The patient was provided an opportunity to ask questions and all were answered. The patient agreed with the plan and demonstrated an understanding of the instructions.   The patient was advised to call back or seek an in-person evaluation if the symptoms worsen or if the condition fails to improve as anticipated.

## 2018-12-20 NOTE — Assessment & Plan Note (Signed)
DM: Diagnosed with diabetes 07-2018, A1c was 6.8, lost more than 40 pounds, A1c April 2020: 5.7.  Praised! HTN: Currently on metoprolol, amlodipine and spironolactone, ambulatory BP usually 130/81, from time to time they go up to 150.  Recommend to continue monitoring BPs, as long as the majority of readings are normal no change. Hypokalemia: Was evaluated by endocrinology, eval for hyperaldosteronism was nondiagnostic with negative urine aldosterone and CAT scans. Was started on Spironolactone, last BMP satisfactory. RTC 1 year for physical exam

## 2018-12-25 ENCOUNTER — Other Ambulatory Visit: Payer: 59

## 2018-12-27 ENCOUNTER — Encounter: Payer: 59 | Admitting: Endocrinology

## 2018-12-27 ENCOUNTER — Other Ambulatory Visit: Payer: Self-pay

## 2018-12-27 ENCOUNTER — Encounter: Payer: Self-pay | Admitting: Endocrinology

## 2018-12-28 ENCOUNTER — Other Ambulatory Visit: Payer: Self-pay

## 2018-12-28 ENCOUNTER — Other Ambulatory Visit (INDEPENDENT_AMBULATORY_CARE_PROVIDER_SITE_OTHER): Payer: 59

## 2018-12-28 DIAGNOSIS — E876 Hypokalemia: Secondary | ICD-10-CM | POA: Diagnosis not present

## 2018-12-28 LAB — BASIC METABOLIC PANEL
BUN: 29 mg/dL — ABNORMAL HIGH (ref 6–23)
CO2: 27 mEq/L (ref 19–32)
Calcium: 9.4 mg/dL (ref 8.4–10.5)
Chloride: 106 mEq/L (ref 96–112)
Creatinine, Ser: 1.14 mg/dL (ref 0.40–1.50)
GFR: 66.52 mL/min (ref >60.00)
Glucose, Bld: 120 mg/dL — ABNORMAL HIGH (ref 70–99)
Potassium: 4.3 mEq/L (ref 3.5–5.1)
Sodium: 140 mEq/L (ref 135–145)

## 2018-12-28 NOTE — Progress Notes (Signed)
This encounter was created in error - please disregard.

## 2018-12-29 ENCOUNTER — Encounter: Payer: Self-pay | Admitting: Endocrinology

## 2018-12-29 ENCOUNTER — Ambulatory Visit (INDEPENDENT_AMBULATORY_CARE_PROVIDER_SITE_OTHER): Payer: 59 | Admitting: Endocrinology

## 2018-12-29 DIAGNOSIS — E876 Hypokalemia: Secondary | ICD-10-CM | POA: Diagnosis not present

## 2018-12-29 DIAGNOSIS — I10 Essential (primary) hypertension: Secondary | ICD-10-CM

## 2018-12-29 NOTE — Progress Notes (Signed)
Patient ID: Cody Fox, male   DOB: 09/28/1960, 58 y.o.   MRN: [id number]          Referring physician: Belinda Fisher   Today's office visit was provided via telemedicine using video technique Explained to the patient and the the limitations of evaluation and management by telemedicine and the availability of in person appointments.  The patient understood the limitations and agreed to proceed. Patient also understood that the telehealth visit is billable. . Location of the patient: Home . Location of the provider: Office Only the patient and myself were participating in the encounter   Chief complaint: Low potassium & blood pressure follow-up  History of Present Illness:  HYPOKALEMIA/hypertension:  Patient states that he has had high blood pressure possibly since he was 58 years old Blood pressure has been usually difficult to control For several years he has had a low potassium level which has not been supplemented consistently  History does not have any symptoms of muscle cramps or muscle weakness and has no history of cardiac arrhythmias Potassium has been as low as 2.9  Home BP has been as high as 189/110 previously  LAB testing: He has been evaluated for hyperaldosteronism  Although his aldosterone level in September 2019 was only 14.6 his aldosterone/renin ratio was 25. This was still not diagnostic of hyperaldosteronism He was told to do salt loading with his urine aldosterone collection but he did not add any extra salt or use salt tablets However his 24-hour urine aldosterone was 11.8 only below the cutoff of 12 He has had a CT scan of his abdomen in 2010 which  did not show an adrenal abnormality  Recent HISTORY Because of his hypokalemia he was started on ALDACTONE 50 mg daily in 07/2018 His amlodipine 10 mg and metoprolol 100 were continued unchanged  He monitors his blood pressure regularly at home Probably using a [facility] monitor and he thinks readings are similar to  when he has them checked in the doctor's office in the past  He says his home blood pressure is unchanged since his last visit Systolic blood pressure usually in the 130s and up to about 268 Diastolic reading is generally around 80-85 This morning blood pressure was 140/85  His potassium which was 4.8 on his last visit is stable at 4.3  BP Readings from Last 3 Encounters:  08/08/18 140/84  03/31/18 (!) 142/92  12/16/17 134/68     Lab Results  Component Value Date   K 4.3 12/28/2018   DIABETES:  He has had mild diabetes with highest A1c 6.8 He was started on home glucose monitoring and has seen the dietitian He has lost about 40 pounds with recent weight about 210  He has made significant changes in his diet with cutting back on carbohydrates and high-fat foods as well as total calories he is also much more regular with his exercise with using a total gym and bicycle  Home blood sugars range from 83-89 before dinner and 91-100 after dinner Blood sugars being done by Accu-Chek meter His A1c is back to normal  Wt Readings from Last 3 Encounters:  11/10/18 210 lb 9.6 oz (95.5 kg)  09/14/18 250 lb (113.4 kg)  08/08/18 251 lb 12.8 oz (114.2 kg)     Lab Results  Component Value Date   HGBA1C 5.9 11/10/2018   HGBA1C 6.8 (H) 08/08/2018   Lab Results  Component Value Date   LDLCALC 89 11/10/2018   CREATININE 1.14 12/28/2018  Past Medical History:  Diagnosis Date  . Diabetes mellitus without complication ([place])   . Hypertension   . Kidney stone   . 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 kidney stone retrieval     via urethra    Family History  Problem Relation Age of Onset  . Hypertension Father   . Hypertension Paternal Uncle   . Coronary artery disease Neg Hx   . Cancer Neg Hx        prostate or colon  . Diabetes Neg Hx   . Colon cancer Neg Hx   . Stroke Neg Hx      Social History:  reports that he quit smoking about 25 years ago. He has never used smokeless tobacco. He reports current alcohol use. He reports that he does not use drugs.  Allergies: No Known Allergies  Allergies as of 12/29/2018   No Known Allergies     Medication List       Accurate as of December 29, 2018 10:46 AM. If you have any questions, ask your nurse or doctor.        Accu-Chek FastClix Lancets Misc Use lancet to check blood sugar twice daily.   Accu-Chek Guide Me w/Device Kit 1 each by Does not apply route 2 (two) times daily. Use meter to check blood sugar 2 times daily.   amLODipine 10 MG tablet Commonly known as:  NORVASC Take 1 tablet (10 mg total) by mouth daily.   cholecalciferol 25 MCG (1000 UT) tablet Commonly known as:  VITAMIN D3 Take 1,000 Units by mouth daily.   glucose blood test strip Commonly known as:  Accu-Chek Guide Use as instructed to check blood sugar 2 times daily.   metoprolol succinate 100 MG 24 hr tablet Commonly known as:  TOPROL-XL Take 1 tablet (100 mg total) by mouth daily. Take with or immediately following a meal.   spironolactone 50 MG tablet Commonly known as:  ALDACTONE Take 1 tablet (50 mg total) by mouth daily. TAKE 1 TABLET BY MOUTH ONCE DAILY.       LABS:  Lab on 12/28/2018  Component Date Value Ref Range Status  . Sodium 12/28/2018 140  135 - 145 mEq/L Final  . Potassium 12/28/2018 4.3  3.5 - 5.1 mEq/L Final  . Chloride 12/28/2018 106  96 - 112 mEq/L Final  . CO2 12/28/2018 27  19 - 32 mEq/L Final  . Glucose, Bld 12/28/2018 120* 70 - 99 mg/dL Final  . BUN 12/28/2018 29* 6 - 23 mg/dL Final  . Creatinine, Ser 12/28/2018 1.14  0.40 - 1.50 mg/dL Final  . Calcium 12/28/2018 9.4  8.4 - 10.5 mg/dL Final  . GFR 12/28/2018 66.52  >60.00 mL/min Final        Review of Systems   PHYSICAL EXAM:  There were no vitals taken for this visit.  Exam not indicated  ASSESSMENT:    Long-standing history of hypertension  which has been variably controlled on 2 drugs in the past  Long-standing hypokalemia not related to diuretics or other clinical cause and not appearing to be from hyperaldosteronism  With adding Aldactone his blood pressure has been much better overall and consistently controlled He monitors regularly at home although is using an unknown brand monitor  Potassium has been controlled with Aldactone and not needing potassium supplements now, most recent potassium 4.3  DIABETES: This has been mild with A1c 6.8 previously and  now 5.9 recently  With his exercise, dietary changes and weight loss blood sugars are excellent even at home  Encouraged him to continue his improved lifestyle changes   PLAN:   No change in medication regimen as yet  If his blood pressure tends to be higher with increased diastolic readings he probably will need an additional medication such as an ACE inhibitor/ARB drug which he is currently not on  He can follow-up with PCP for further management  Elayne Snare 12/29/2018, 10:46 AM

## 2019-02-15 ENCOUNTER — Other Ambulatory Visit: Payer: Self-pay | Admitting: Endocrinology

## 2019-02-15 DIAGNOSIS — E876 Hypokalemia: Secondary | ICD-10-CM

## 2019-02-16 ENCOUNTER — Other Ambulatory Visit: Payer: 59

## 2019-02-20 ENCOUNTER — Ambulatory Visit: Payer: 59 | Admitting: Endocrinology

## 2019-03-02 ENCOUNTER — Encounter: Payer: 59 | Admitting: Gastroenterology

## 2019-03-05 ENCOUNTER — Encounter: Payer: 59 | Admitting: Gastroenterology

## 2019-03-09 ENCOUNTER — Other Ambulatory Visit: Payer: Self-pay

## 2019-03-09 ENCOUNTER — Ambulatory Visit (AMBULATORY_SURGERY_CENTER): Payer: Self-pay

## 2019-03-09 VITALS — Ht 69.0 in | Wt 198.6 lb

## 2019-03-09 DIAGNOSIS — Z86010 Personal history of colonic polyps: Secondary | ICD-10-CM

## 2019-03-09 MED ORDER — PEG 3350-KCL-NA BICARB-NACL 420 G PO SOLR: 4000.0000 mL | Freq: Once | ORAL | 0 refills | Status: AC

## 2019-03-09 NOTE — Progress Notes (Signed)
Denies allergies to eggs or soy products. Denies complication of anesthesia or sedation. Denies use of weight loss medication. Denies use of O2.   Emmi instructions given for colonoscopy.  Patient states that he is having trouble finding someone to be his care partner for his procedure. Patient was given information for Appointment Mate. Patient will call if he can't find anyone to be his care partner.

## 2019-03-20 ENCOUNTER — Encounter: Payer: Self-pay | Admitting: Gastroenterology

## 2019-03-22 ENCOUNTER — Telehealth: Payer: Self-pay

## 2019-03-22 NOTE — Telephone Encounter (Signed)
Covid-19 screening questions   Do you now or have you had a fever in the last 14 days?  Do you have any respiratory symptoms of shortness of breath or cough now or in the last 14 days?  Do you have any family members or close contacts with diagnosed or suspected Covid-19 in the past 14 days?  Have you been tested for Covid-19 and found to be positive?      L/m to c/b.

## 2019-03-22 NOTE — Telephone Encounter (Signed)
Pt responded "no" to all screening questions °

## 2019-03-23 ENCOUNTER — Other Ambulatory Visit: Payer: Self-pay

## 2019-03-23 ENCOUNTER — Ambulatory Visit (AMBULATORY_SURGERY_CENTER): Payer: 59 | Admitting: Gastroenterology

## 2019-03-23 ENCOUNTER — Encounter: Payer: Self-pay | Admitting: Gastroenterology

## 2019-03-23 VITALS — BP 146/75 | HR 51 | Temp 96.0°F | Resp 14 | Ht 69.0 in | Wt 195.0 lb

## 2019-03-23 DIAGNOSIS — Z86010 Personal history of colonic polyps: Secondary | ICD-10-CM | POA: Diagnosis present

## 2019-03-23 DIAGNOSIS — K635 Polyp of colon: Secondary | ICD-10-CM

## 2019-03-23 DIAGNOSIS — D125 Benign neoplasm of sigmoid colon: Secondary | ICD-10-CM

## 2019-03-23 MED ORDER — SODIUM CHLORIDE 0.9 % IV SOLN: 500.0000 mL | Freq: Once | INTRAVENOUS | Status: DC

## 2019-03-23 NOTE — Progress Notes (Signed)
Pt's states no medical or surgical changes since previsit or office visit.  Vs by cw  covid screen and temp by JB

## 2019-03-23 NOTE — Progress Notes (Signed)
Report to PACU, RN, vss, BBS= Clear.  

## 2019-03-23 NOTE — Progress Notes (Signed)
Called to room to assist during endoscopic procedure.  Patient ID and intended procedure confirmed with present staff. Received instructions for my participation in the procedure from the performing physician.  

## 2019-03-23 NOTE — Op Note (Signed)
[facility] Patient Name: Cody Fox Procedure Date: 03/23/2019 9:54 AM MRN: [id number] Endoscopist: Milus Banister , MD Age: 58 Referring MD:  Date of Birth: July 23, 1961 Gender: Male Account #: [id number] Procedure:                Colonoscopy Indications:              High risk colon cancer surveillance: Personal                            history of colonic polyps; colonoscopy 2016 found 4                            TAs, one was 76mm Medicines:                Monitored Anesthesia Care Procedure:                Pre-Anesthesia Assessment:                           - Prior to the procedure, a History and Physical                            was performed, and patient medications and                            allergies were reviewed. The patient's tolerance of                            previous anesthesia was also reviewed. The risks                            and benefits of the procedure and the sedation                            options and risks were discussed with the patient.                            All questions were answered, and informed consent                            was obtained. Prior Anticoagulants: The patient has                            taken no previous anticoagulant or antiplatelet                            agents. ASA Grade Assessment: II - A patient with                            mild systemic disease. After reviewing the risks                            and benefits, the patient was deemed in  satisfactory condition to undergo the procedure.                           After obtaining informed consent, the colonoscope                            was passed under direct vision. Throughout the                            procedure, the patient's blood pressure, pulse, and                            oxygen saturations were monitored continuously. The                            Colonoscope was introduced through the anus and                             advanced to the the cecum, identified by                            appendiceal orifice and ileocecal valve. The                            colonoscopy was performed without difficulty. The                            patient tolerated the procedure well. The quality                            of the bowel preparation was good. The ileocecal                            valve, appendiceal orifice, and rectum were                            photographed. Scope In: 10:13:38 AM Scope Out: 10:23:06 AM Scope Withdrawal Time: 0 hours 6 minutes 55 seconds  Total Procedure Duration: 0 hours 9 minutes 28 seconds  Findings:                 A 1 mm polyp was found in the sigmoid colon. The                            polyp was sessile. The polyp was removed with a                            cold snare. Resection and retrieval were complete.                           The exam was otherwise without abnormality on                            direct and retroflexion views. Complications:  No immediate complications. Estimated blood loss:                            None. Estimated Blood Loss:     Estimated blood loss: none. Impression:               - One 1 mm polyp in the sigmoid colon, removed with                            a cold snare. Resected and retrieved.                           - The examination was otherwise normal on direct                            and retroflexion views. Recommendation:           - Patient has a contact number available for                            emergencies. The signs and symptoms of potential                            delayed complications were discussed with the                            patient. Return to normal activities tomorrow.                            Written discharge instructions were provided to the                            patient.                           - Resume previous diet.                           -  Continue present medications.                           - Await pathology results. Likely repeat                            colonoscopy in 5 years given HRA in 2016 Milus Banister, MD 03/23/2019 10:25:58 AM This report has been signed electronically.

## 2019-03-23 NOTE — Patient Instructions (Signed)
Handout on polyps given   YOU HAD AN ENDOSCOPIC PROCEDURE TODAY AT THE [facility]:   Refer to the procedure report that was given to you for any specific questions about what was found during the examination.  If the procedure report does not answer your questions, please call your gastroenterologist to clarify.  If you requested that your care partner not be given the details of your procedure findings, then the procedure report has been included in a sealed envelope for you to review at your convenience later.  YOU SHOULD EXPECT: Some feelings of bloating in the abdomen. Passage of more gas than usual.  Walking can help get rid of the air that was put into your GI tract during the procedure and reduce the bloating. If you had a lower endoscopy (such as a colonoscopy or flexible sigmoidoscopy) you may notice spotting of blood in your stool or on the toilet paper. If you underwent a bowel prep for your procedure, you may not have a normal bowel movement for a few days.  Please Note:  You might notice some irritation and congestion in your nose or some drainage.  This is from the oxygen used during your procedure.  There is no need for concern and it should clear up in a day or so.  SYMPTOMS TO REPORT IMMEDIATELY:   Following lower endoscopy (colonoscopy or flexible sigmoidoscopy):  Excessive amounts of blood in the stool  Significant tenderness or worsening of abdominal pains  Swelling of the abdomen that is new, acute  Fever of 100F or higher   For urgent or emergent issues, a gastroenterologist can be reached at any hour by calling [phone].   DIET:  We do recommend a small meal at first, but then you may proceed to your regular diet.  Drink plenty of fluids but you should avoid alcoholic beverages for 24 hours.  ACTIVITY:  You should plan to take it easy for the rest of today and you should NOT DRIVE or use heavy machinery until tomorrow (because of the sedation  medicines used during the test).    FOLLOW UP: Our staff will call the number listed on your records 48-72 hours following your procedure to check on you and address any questions or concerns that you may have regarding the information given to you following your procedure. If we do not reach you, we will leave a message.  We will attempt to reach you two times.  During this call, we will ask if you have developed any symptoms of COVID 19. If you develop any symptoms (ie: fever, flu-like symptoms, shortness of breath, cough etc.) before then, please call [phone].  If you test positive for Covid 19 in the 2 weeks post procedure, please call and report this information to us.    If any biopsies were taken you will be contacted by phone or by letter within the next 1-3 weeks.  Please call us at [phone] if you have not heard about the biopsies in 3 weeks.    SIGNATURES/CONFIDENTIALITY: You and/or your care partner have signed paperwork which will be entered into your electronic medical record.  These signatures attest to the fact that that the information above on your After Visit Summary has been reviewed and is understood.  Full responsibility of the confidentiality of this discharge information lies with you and/or your care-partner. 

## 2019-03-27 ENCOUNTER — Telehealth: Payer: Self-pay | Admitting: *Deleted

## 2019-03-27 NOTE — Telephone Encounter (Signed)
1. Have you developed a fever since your procedure? no  2.   Have you had an respiratory symptoms (SOB or cough) since your procedure? no  3.   Have you tested positive for COVID 19 since your procedure no  4.   Have you had any family members/close contacts diagnosed with the COVID 19 since your procedure?  no   If yes to any of these questions please route to Joylene John, RN and Alphonsa Gin, Therapist, sports.     Follow up Call-  Call back number 03/23/2019  Post procedure Call Back phone  # [phone]  Permission to leave phone message Yes  Some recent data might be hidden     Patient questions:  Do you have a fever, pain , or abdominal swelling? No. Pain Score  0 *  Have you tolerated food without any problems? Yes.    Have you been able to return to your normal activities? Yes.    Do you have any questions about your discharge instructions: Diet   No. Medications  No. Follow up visit  No.  Do you have questions or concerns about your Care? No.  Actions: * If pain score is 4 or above: No action needed, pain <4.

## 2019-03-28 ENCOUNTER — Encounter: Payer: Self-pay | Admitting: Gastroenterology

## 2019-04-24 ENCOUNTER — Other Ambulatory Visit: Payer: Self-pay | Admitting: Internal Medicine

## 2019-05-03 ENCOUNTER — Other Ambulatory Visit: Payer: Self-pay | Admitting: Internal Medicine

## 2019-08-15 ENCOUNTER — Other Ambulatory Visit: Payer: Self-pay

## 2019-08-15 MED ORDER — SPIRONOLACTONE 50 MG PO TABS: 50.0000 mg | ORAL_TABLET | Freq: Every day | ORAL | 0 refills | Status: DC

## 2019-08-22 ENCOUNTER — Other Ambulatory Visit: Payer: Self-pay | Admitting: Internal Medicine

## 2019-12-14 ENCOUNTER — Telehealth: Payer: Self-pay | Admitting: Internal Medicine

## 2019-12-14 MED ORDER — SPIRONOLACTONE 50 MG PO TABS: 50.0000 mg | ORAL_TABLET | Freq: Every day | ORAL | 0 refills | Status: DC

## 2019-12-14 NOTE — Telephone Encounter (Signed)
Done

## 2019-12-14 NOTE — Telephone Encounter (Signed)
Medication: spironolactone (ALDACTONE) 50 MG tablet  (Patient only has one left. Marked as high. Did tell him normal turn around time  )   Has the patient contacted their pharmacy? Yes.   (If no, request that the patient contact the pharmacy for the refill.) (If yes, when and what did the pharmacy advise?) No more refill call pcp   Preferred Pharmacy (with phone number or street name):  [id number] [address], [place] [address]  [place], [place] [address]  Phone:  [phone] Fax:  [phone]   Agent: Please be advised that RX refills may take up to 3 business days. We ask that you follow-up with your pharmacy.

## 2019-12-14 NOTE — Telephone Encounter (Signed)
[facility] Due for CPE. Please schedule at his earliest convenience.

## 2019-12-21 ENCOUNTER — Other Ambulatory Visit: Payer: Self-pay

## 2019-12-21 ENCOUNTER — Encounter: Payer: Self-pay | Admitting: Internal Medicine

## 2019-12-21 ENCOUNTER — Ambulatory Visit (INDEPENDENT_AMBULATORY_CARE_PROVIDER_SITE_OTHER): Payer: 59 | Admitting: Internal Medicine

## 2019-12-21 VITALS — BP 128/80 | HR 66 | Resp 17 | Ht 69.0 in | Wt 223.0 lb

## 2019-12-21 DIAGNOSIS — Z0000 Encounter for general adult medical examination without abnormal findings: Secondary | ICD-10-CM | POA: Diagnosis not present

## 2019-12-21 NOTE — Patient Instructions (Signed)
Check your blood pressure monthly BP GOAL is between 110/65 and  135/85. If it is consistently higher or lower, let me know    [place], [place] back for blood work next week, fasting.  Come back for a physical exam in 1 year, sooner if needed.

## 2019-12-21 NOTE — Progress Notes (Signed)
Subjective:    Patient ID: Cody Fox, male    DOB: 1960-09-26, 59 y.o.   MRN: [id number]  DOS:  12/21/2019 Type of visit - description: CPX  In general feeling well, did c/o chronic decreased left hearing without pain or discharge. Occasional tinnitus    Review of Systems  Other than above, a 14 point review of systems is negative     Past Medical History:  Diagnosis Date  . Allergy   . Arthritis   . Diabetes mellitus without complication ([place])    Lost weight not on meds  . Hypertension   . Kidney stone   . 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 kidney stone retrieval     via urethra   Family History  Problem Relation Age of Onset  . Hypertension Father   . Hypertension Paternal Uncle   . Coronary artery disease Neg Hx   . Cancer Neg Hx        prostate or colon  . Diabetes Neg Hx   . Colon cancer Neg Hx   . Stroke Neg Hx   . Esophageal cancer Neg Hx   . Rectal cancer Neg Hx   . Stomach cancer Neg Hx     Allergies as of 12/21/2019   No Known Allergies     Medication List       Accurate as of Dec 21, 2019 11:59 PM. If you have any questions, ask your nurse or doctor.        Accu-Chek FastClix Lancets Misc Use lancet to check blood sugar twice daily.   Accu-Chek Guide Me w/Device Kit 1 each by Does not apply route 2 (two) times daily. Use meter to check blood sugar 2 times daily.   amLODipine 10 MG tablet Commonly known as: NORVASC Take 1 tablet (10 mg total) by mouth daily.   cholecalciferol 25 MCG (1000 UNIT) tablet Commonly known as: VITAMIN D3 Take 1,000 Units by mouth daily.   glucose blood test strip Commonly known as: Accu-Chek Guide Use as instructed to check blood sugar 2 times daily.   metoprolol succinate 100 MG 24 hr tablet Commonly known as: TOPROL-XL Take 1 tablet (100 mg total) by mouth daily. Take with or immediately following a meal.    spironolactone 50 MG tablet Commonly known as: ALDACTONE Take 1 tablet (50 mg total) by mouth daily.          Objective:   Physical Exam BP 128/80 (BP Location: Left Arm, Patient Position: Sitting, Cuff Size: Large)   Pulse 66   Resp 17   Ht 5' 9" (1.753 m)   Wt 223 lb (101.2 kg)   SpO2 99%   BMI 32.93 kg/m  General: Well developed, NAD, BMI noted Neck: No  thyromegaly  HEENT:  Normocephalic . Face symmetric, atraumatic Right ear: Normal Left ear: TM is flat, has some scar tissue.  Otherwise exam is negative. Lungs:  CTA B Normal respiratory effort, no intercostal retractions, no accessory muscle use. Heart: RRR,  no murmur.  Abdomen:  Not distended, soft, non-tender. No rebound or rigidity. DRE: Normal sphincter tone, no stools found, prostate: Normal size, not tender, not nodular. Lower extremities: no pretibial edema bilaterally  Skin: Exposed areas without rash. Not pale. Not jaundice Neurologic:  alert & oriented X3.  Speech normal, gait appropriate for age and unassisted Strength symmetric and appropriate for age.  Psych: Cognition and judgment  appear intact.  Cooperative with normal attention span and concentration.  Behavior appropriate. No anxious or depressed appearing.     Assessment       Assessment DM HTN Hypokalemia:Was evaluated by endocrinology, eval for hyperaldosteronism was nondiagnostic with negative urine aldosterone and CAT scans. Macular degeneration 2012 Kidney stones  PLAN: Here for CPX DM: At some point A1c was 6.8, subsequent A1c was 5.9.  Has a healthy lifestyle.  Recheck A1c. HTN: Currently on amlodipine, metoprolol, Aldactone.  BP today is great, ambulatory BPs are rarely checked but normal.  Checking labs Decreased hearing: Left side, reports TM injury many years ago, refer to [facility]. RTC 1 year.    This visit occurred during the SARS-CoV-2 public health emergency.  Safety protocols were in place, including  screening questions prior to the visit, additional usage of staff PPE, and extensive cleaning of exam room while observing appropriate contact time as indicated for disinfecting solutions.

## 2019-12-24 NOTE — Assessment & Plan Note (Signed)
-  Td 11-2015 - s/p Covid shot x 1  -Colon cancer screening: Colonoscopy 09-2014, and 02/2019, next per GI -Prostate cancer screening: DRE wnl check a  PSA  - labs: CMP, FLP, CBC, A1c, PSA

## 2019-12-24 NOTE — Assessment & Plan Note (Signed)
Here for CPX DM: At some point A1c was 6.8, subsequent A1c was 5.9.  Has a healthy lifestyle.  Recheck A1c. HTN: Currently on amlodipine, metoprolol, Aldactone.  BP today is great, ambulatory BPs are rarely checked but normal.  Checking labs Decreased hearing: Left side, reports TM injury many years ago, refer to [facility]. RTC 1 year.

## 2019-12-25 ENCOUNTER — Other Ambulatory Visit: Payer: Self-pay

## 2019-12-25 ENCOUNTER — Other Ambulatory Visit (INDEPENDENT_AMBULATORY_CARE_PROVIDER_SITE_OTHER): Payer: 59

## 2019-12-25 DIAGNOSIS — Z125 Encounter for screening for malignant neoplasm of prostate: Secondary | ICD-10-CM | POA: Diagnosis not present

## 2019-12-25 DIAGNOSIS — Z0000 Encounter for general adult medical examination without abnormal findings: Secondary | ICD-10-CM

## 2019-12-26 ENCOUNTER — Ambulatory Visit: Payer: 59 | Admitting: Internal Medicine

## 2019-12-26 LAB — CBC WITH DIFFERENTIAL/PLATELET
Basophils Absolute: 0.1 10*3/uL (ref 0.0–0.1)
Basophils Relative: 0.9 % (ref 0.0–3.0)
Eosinophils Absolute: 0.1 10*3/uL (ref 0.0–0.7)
Eosinophils Relative: 1.2 % (ref 0.0–5.0)
HCT: 38.3 % — ABNORMAL LOW (ref 39.0–52.0)
Hemoglobin: 13.3 g/dL (ref 13.0–17.0)
Lymphocytes Relative: 21.8 % (ref 12.0–46.0)
Lymphs Abs: 2 10*3/uL (ref 0.7–4.0)
MCHC: 34.8 g/dL (ref 30.0–36.0)
MCV: 89.4 fl (ref 78.0–100.0)
Monocytes Absolute: 0.8 10*3/uL (ref 0.1–1.0)
Monocytes Relative: 8.5 % (ref 3.0–12.0)
Neutro Abs: 6.2 10*3/uL (ref 1.4–7.7)
Neutrophils Relative %: 67.6 % (ref 43.0–77.0)
Platelets: 343 10*3/uL (ref 150.0–400.0)
RBC: 4.28 Mil/uL (ref 4.22–5.81)
RDW: 13.4 % (ref 11.5–15.5)
WBC: 9.2 10*3/uL (ref 4.0–10.5)

## 2019-12-26 LAB — COMPREHENSIVE METABOLIC PANEL
ALT: 17 U/L (ref 0–53)
AST: 18 U/L (ref 0–37)
Albumin: 4.5 g/dL (ref 3.5–5.2)
Alkaline Phosphatase: 52 U/L (ref 39–117)
BUN: 24 mg/dL — ABNORMAL HIGH (ref 6–23)
CO2: 27 mEq/L (ref 19–32)
Calcium: 9.6 mg/dL (ref 8.4–10.5)
Chloride: 101 mEq/L (ref 96–112)
Creatinine, Ser: 1.19 mg/dL (ref 0.40–1.50)
GFR: 63.08 mL/min (ref >60.00)
Glucose, Bld: 80 mg/dL (ref 70–99)
Potassium: 4.8 mEq/L (ref 3.5–5.1)
Sodium: 135 mEq/L (ref 135–145)
Total Bilirubin: 0.6 mg/dL (ref 0.2–1.2)
Total Protein: 6.7 g/dL (ref 6.0–8.3)

## 2019-12-26 LAB — LIPID PANEL
Cholesterol: 172 mg/dL (ref 0–200)
HDL: 31.4 mg/dL — ABNORMAL LOW (ref >39.00)
NonHDL: 140.43
Total CHOL/HDL Ratio: 5
Triglycerides: 280 mg/dL — ABNORMAL HIGH (ref 0.0–149.0)
VLDL: 56 mg/dL — ABNORMAL HIGH (ref 0.0–40.0)

## 2019-12-26 LAB — HEMOGLOBIN A1C: Hgb A1c MFr Bld: 5.7 % (ref 4.6–6.5)

## 2019-12-26 LAB — PSA: PSA: 0.61 ng/mL (ref 0.10–4.00)

## 2019-12-26 LAB — LDL CHOLESTEROL, DIRECT: Direct LDL: 94 mg/dL

## 2020-01-19 ENCOUNTER — Other Ambulatory Visit: Payer: Self-pay | Admitting: Internal Medicine

## 2020-02-11 ENCOUNTER — Encounter: Payer: Self-pay | Admitting: Internal Medicine

## 2020-04-01 ENCOUNTER — Other Ambulatory Visit: Payer: Self-pay | Admitting: Internal Medicine

## 2020-06-21 ENCOUNTER — Other Ambulatory Visit: Payer: Self-pay | Admitting: Internal Medicine

## 2020-09-04 ENCOUNTER — Other Ambulatory Visit: Payer: Self-pay | Admitting: Internal Medicine

## 2020-11-22 ENCOUNTER — Other Ambulatory Visit: Payer: Self-pay | Admitting: Internal Medicine

## 2020-12-23 ENCOUNTER — Encounter: Payer: Self-pay | Admitting: Internal Medicine

## 2020-12-23 ENCOUNTER — Other Ambulatory Visit: Payer: Self-pay

## 2020-12-23 ENCOUNTER — Ambulatory Visit (INDEPENDENT_AMBULATORY_CARE_PROVIDER_SITE_OTHER): Payer: 59 | Admitting: Internal Medicine

## 2020-12-23 VITALS — BP 132/90 | HR 80 | Temp 98.6°F | Resp 16 | Ht 69.0 in | Wt 249.4 lb

## 2020-12-23 DIAGNOSIS — Z0100 Encounter for examination of eyes and vision without abnormal findings: Secondary | ICD-10-CM

## 2020-12-23 DIAGNOSIS — E119 Type 2 diabetes mellitus without complications: Secondary | ICD-10-CM

## 2020-12-23 DIAGNOSIS — Z0000 Encounter for general adult medical examination without abnormal findings: Secondary | ICD-10-CM | POA: Diagnosis not present

## 2020-12-23 DIAGNOSIS — H3530 Unspecified macular degeneration: Secondary | ICD-10-CM

## 2020-12-23 NOTE — Assessment & Plan Note (Signed)
Here for CPX DM: Diet controlled, ambulatory CBGs when checked typically okay.  Check A1c and micro Dyslipidemia: Advised patient that in light od DM, is standard of care to take a statin, he is reluctant, we agreed to reassess the issue periodically.   HTN: Good compliance with amlodipine, Aldactone, metoprolol.  Ambulatory BPs in the 120s/75 range. Macular degeneration: Unable to see his routine ophthalmologist, will send a referral. RTC 6 m

## 2020-12-23 NOTE — Progress Notes (Signed)
 Subjective:    Patient ID: Cody Fox, male    DOB: 11/10/1960, 60 y.o.   MRN: [id number]  DOS:  12/23/2020 Type of visit - description: CPX  Here for CPX No major concerns. From time to time has a dry cough, typically associated with changes in environmental temperature. Denies any fever or chills.  No wheezing.  Review of Systems  Other than above, a 14 point review of systems is negative     Past Medical History:  Diagnosis Date  . Allergy   . Arthritis   . Diabetes mellitus without complication ([facility])    Lost weight not on meds  . Hypertension   . Kidney stone   . 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 kidney stone retrieval     via urethra   Social History   Socioeconomic History  . Marital status: Divorced    Spouse name: Not on file  . Number of children: 0  . Years of education: Not on file  . Highest education level: Not on file  Occupational History  . 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 Smoking status: Former Smoker    Quit date: 07/26/1986    Years since quitting: 34.4  . Smokeless tobacco: Never Used  Substance and Sexual Activity  . Alcohol use: Yes    Comment: rarely  . Drug use: No  . Sexual activity: Not on file  Other Topics Concern  . Not on file  Social History Narrative   divorced    no children    Social Determinants of Health   Financial Resource Strain: Not on file  Food Insecurity: Not on file  Transportation Needs: Not on file  Physical Activity: Not on file  Stress: Not on file  Social Connections: Not on file  Intimate Partner Violence: Not on file    Allergies as of 12/23/2020   No Known Allergies     Medication List       Accurate as of Dec 23, 2020  9:30 PM. If you have any questions, ask your nurse or doctor.        Accu-Chek FastClix Lancets Misc Use lancet  to check blood sugar twice daily.   Accu-Chek Guide Me w/Device Kit 1 each by Does not apply route 2 (two) times daily. Use meter to check blood sugar 2 times daily.   amLODipine 10 MG tablet Commonly known as: NORVASC Take 1 tablet (10 mg total) by mouth daily.   cholecalciferol 25 MCG (1000 UNIT) tablet Commonly known as: VITAMIN D3 Take 1,000 Units by mouth daily.   glucose blood test strip Commonly known as: Accu-Chek Guide Use as instructed to check blood sugar 2 times daily.   metoprolol succinate 100 MG 24 hr tablet Commonly known as: TOPROL-XL Take 1 tablet (100 mg total) by mouth daily. Take with or immediately following a meal   spironolactone 50 MG tablet Commonly known as: ALDACTONE Take 1 tablet (50 mg total) by mouth daily.          Objective:   Physical Exam BP 132/90 (BP Location: Left Arm, Patient Position: Sitting, Cuff Size: Normal)   Pulse 80   Temp 98.6 F (37 C) (Oral)   Resp 16   Ht 5' 9" (1.753 m)   Wt 249 lb 6 oz (113.1 kg)     SpO2 98%   BMI 36.83 kg/m  General: Well developed, NAD, BMI noted Neck: No  thyromegaly  HEENT:  Normocephalic . Face symmetric, atraumatic Lungs:  CTA B Normal respiratory effort, no intercostal retractions, no accessory muscle use. Heart: RRR,  no murmur.  Abdomen:  Not distended, soft, non-tender. No rebound or rigidity.   Lower extremities: no pretibial edema bilaterally  Skin: Exposed areas without rash. Not pale. Not jaundice Neurologic:  alert & oriented X3.  Speech normal, gait appropriate for age and unassisted Strength symmetric and appropriate for age.  Psych: Cognition and judgment appear intact.  Cooperative with normal attention span and concentration.  Behavior appropriate. No anxious or depressed appearing.     Assessment    Assessment DM HTN Hypokalemia:Was evaluated by endocrinology, eval for hyperaldosteronism was nondiagnostic with negative urine aldosterone and CAT  scans. Macular degeneration 2012 Kidney stones Hearing loss (saw ENT 01/2020)  PLAN: Here for CPX DM: Diet controlled, ambulatory CBGs when checked typically okay.  Check A1c and micro Dyslipidemia: Advised patient that in light od DM, is standard of care to take a statin, he is reluctant, we agreed to reassess the issue periodically.   HTN: Good compliance with amlodipine, Aldactone, metoprolol.  Ambulatory BPs in the 120s/75 range. Macular degeneration: Unable to see his routine ophthalmologist, will send a referral. RTC 6 m     This visit occurred during the SARS-CoV-2 public health emergency.  Safety protocols were in place, including screening questions prior to the visit, additional usage of staff PPE, and extensive cleaning of exam room while observing appropriate contact time as indicated for disinfecting solutions.  

## 2020-12-23 NOTE — Assessment & Plan Note (Signed)
-  Td 11-2015 - s/p Covid shot x 3, ok to proceed w/ 4th shot  -Shingrix is an option.  See AVS -Colon cancer screening: Colonoscopy 09-2014, and 02/2019, next per GI -Prostate cancer screening: DRE PSA 12/2019: wnl   - labs: CMP, FLP, CBC, A1c, micro. -Diet exercise: Gained weight lately, he is already taking some actions to correct that.  Eating healthier, exercising more.

## 2020-12-23 NOTE — Patient Instructions (Addendum)
Check the  blood pressure twice a month  BP GOAL is between 110/65 and  135/85. If it is consistently higher or lower, let me know  Proceed with the next COVID-vaccine 4 months after the last  Proceed with shingles vaccination few weeks after your COVID-vaccine   GO TO THE LAB : Get the blood work     Cody Fox, [place] back for a checkup in 6 months

## 2020-12-24 LAB — CBC WITH DIFFERENTIAL/PLATELET
Basophils Absolute: 0.1 10*3/uL (ref 0.0–0.1)
Basophils Relative: 1 % (ref 0.0–3.0)
Eosinophils Absolute: 0.1 10*3/uL (ref 0.0–0.7)
Eosinophils Relative: 1.3 % (ref 0.0–5.0)
HCT: 38.3 % — ABNORMAL LOW (ref 39.0–52.0)
Hemoglobin: 13.3 g/dL (ref 13.0–17.0)
Lymphocytes Relative: 20.2 % (ref 12.0–46.0)
Lymphs Abs: 1.8 10*3/uL (ref 0.7–4.0)
MCHC: 34.6 g/dL (ref 30.0–36.0)
MCV: 88.3 fl (ref 78.0–100.0)
Monocytes Absolute: 0.9 10*3/uL (ref 0.1–1.0)
Monocytes Relative: 9.6 % (ref 3.0–12.0)
Neutro Abs: 6.1 10*3/uL (ref 1.4–7.7)
Neutrophils Relative %: 67.9 % (ref 43.0–77.0)
Platelets: 341 10*3/uL (ref 150.0–400.0)
RBC: 4.34 Mil/uL (ref 4.22–5.81)
RDW: 14.2 % (ref 11.5–15.5)
WBC: 9 10*3/uL (ref 4.0–10.5)

## 2020-12-24 LAB — COMPREHENSIVE METABOLIC PANEL
ALT: 36 U/L (ref 0–53)
AST: 25 U/L (ref 0–37)
Albumin: 4.5 g/dL (ref 3.5–5.2)
Alkaline Phosphatase: 55 U/L (ref 39–117)
BUN: 30 mg/dL — ABNORMAL HIGH (ref 6–23)
CO2: 26 mEq/L (ref 19–32)
Calcium: 10.2 mg/dL (ref 8.4–10.5)
Chloride: 102 mEq/L (ref 96–112)
Creatinine, Ser: 1.23 mg/dL (ref 0.40–1.50)
GFR: 65.09 mL/min (ref >60.00)
Glucose, Bld: 92 mg/dL (ref 70–99)
Potassium: 4.4 mEq/L (ref 3.5–5.1)
Sodium: 137 mEq/L (ref 135–145)
Total Bilirubin: 0.4 mg/dL (ref 0.2–1.2)
Total Protein: 6.7 g/dL (ref 6.0–8.3)

## 2020-12-24 LAB — MICROALBUMIN / CREATININE URINE RATIO
Creatinine,U: 180.3 mg/dL
Microalb Creat Ratio: 0.5 mg/g (ref 0.0–30.0)
Microalb, Ur: 0.9 mg/dL (ref 0.0–1.9)

## 2020-12-24 LAB — LIPID PANEL
Cholesterol: 187 mg/dL (ref 0–200)
HDL: 32.4 mg/dL — ABNORMAL LOW
Total CHOL/HDL Ratio: 6
Triglycerides: 556 mg/dL — ABNORMAL HIGH (ref 0.0–149.0)

## 2020-12-24 LAB — HEMOGLOBIN A1C: Hgb A1c MFr Bld: 6.5 % (ref 4.6–6.5)

## 2020-12-24 LAB — LDL CHOLESTEROL, DIRECT: Direct LDL: 88 mg/dL

## 2021-04-27 ENCOUNTER — Other Ambulatory Visit: Payer: Self-pay | Admitting: Internal Medicine

## 2021-05-02 ENCOUNTER — Other Ambulatory Visit: Payer: Self-pay | Admitting: Internal Medicine

## 2021-06-17 ENCOUNTER — Ambulatory Visit: Payer: 59 | Admitting: Internal Medicine

## 2021-10-09 ENCOUNTER — Other Ambulatory Visit: Payer: Self-pay | Admitting: Internal Medicine

## 2021-10-09 NOTE — Telephone Encounter (Signed)
30 day supply sent, letter mailed to Pt informing he is due for visit.  ?

## 2021-10-27 ENCOUNTER — Other Ambulatory Visit: Payer: Self-pay | Admitting: Internal Medicine

## 2021-11-23 ENCOUNTER — Other Ambulatory Visit: Payer: Self-pay | Admitting: Internal Medicine

## 2021-11-26 ENCOUNTER — Telehealth: Payer: Self-pay

## 2021-11-26 MED ORDER — SPIRONOLACTONE 50 MG PO TABS: 50.0000 mg | ORAL_TABLET | Freq: Every day | ORAL | 0 refills | Status: DC

## 2021-11-26 MED ORDER — METOPROLOL SUCCINATE ER 100 MG PO TB24: 100.0000 mg | ORAL_TABLET | Freq: Every day | ORAL | 0 refills | Status: DC

## 2021-11-26 NOTE — Telephone Encounter (Signed)
Patient Name Cody Fox ?Patient DOB [birth year] ?Call Type Pharmacy Message Only ?Reason for Call Request to send message to Office ?Initial Comment Mail order pharmacy requesting Rx refill. ?Additional Comment Rx is Spironolactone '50mg'$  and Metoprolol Provided office hours. ?Pharmacy Name [facility] ?Pharmacist Name Morey Hummingbird ?Pharmacy Number [phone] ?Disp. Time Disposition Final User ?11/26/2021 12:21:42 PM General Information Provided Yes Allena Napoleon ?Call Closed By: Allena Napoleon ?Transaction Date/Time: 11/26/2021 12:17:18 PM (ET) ?

## 2021-11-26 NOTE — Telephone Encounter (Signed)
I have sent a 30 day supply of both medications, a letter was mailed to Pt on 10/09/21 informing he is overdue for visit. No further refills w/o appt.  ?

## 2022-02-02 ENCOUNTER — Other Ambulatory Visit: Payer: Self-pay | Admitting: Internal Medicine

## 2022-02-24 ENCOUNTER — Encounter: Payer: Self-pay | Admitting: Internal Medicine

## 2022-02-24 ENCOUNTER — Ambulatory Visit (INDEPENDENT_AMBULATORY_CARE_PROVIDER_SITE_OTHER): Payer: 59 | Admitting: Internal Medicine

## 2022-02-24 VITALS — BP 130/80 | HR 77 | Temp 98.3°F | Resp 18 | Ht 69.0 in | Wt 250.4 lb

## 2022-02-24 DIAGNOSIS — Z0000 Encounter for general adult medical examination without abnormal findings: Secondary | ICD-10-CM | POA: Diagnosis not present

## 2022-02-24 DIAGNOSIS — I10 Essential (primary) hypertension: Secondary | ICD-10-CM

## 2022-02-24 DIAGNOSIS — E119 Type 2 diabetes mellitus without complications: Secondary | ICD-10-CM | POA: Diagnosis not present

## 2022-02-24 DIAGNOSIS — E785 Hyperlipidemia, unspecified: Secondary | ICD-10-CM | POA: Diagnosis not present

## 2022-02-24 DIAGNOSIS — Z125 Encounter for screening for malignant neoplasm of prostate: Secondary | ICD-10-CM

## 2022-02-24 DIAGNOSIS — E1169 Type 2 diabetes mellitus with other specified complication: Secondary | ICD-10-CM

## 2022-02-24 NOTE — Patient Instructions (Addendum)
Recommend to proceed with covid vaccine (bivalent) at your pharmacy.  Flu shot this fall.  Shingrix is an option  GO TO THE LAB : Get the blood work     [place], [place] Come back for   a checkup in 4 months  Please read the information about healthcare power of attorney   Per our records you are due for your diabetic eye exam. Please contact your eye doctor to schedule an appointment. Please have them send copies of your office visit notes to [place]. Our fax number is [phone]. If you need a referral to an eye doctor please let us know.

## 2022-02-24 NOTE — Progress Notes (Unsigned)
 Subjective:    Patient ID: Cody Fox, male    DOB: 01/24/1961, 61 y.o.   MRN: [id number]  DOS:  02/24/2022 Type of visit - description: CPX  Here for CPX. He went through a rough patch few months ago, lost 5 family members, 1 from a heart attack and 4 from COVID. Was somewhat depressed but fortunately he bounced back and is feeling better.   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[employer]    Employer: [employer]    Comment: @ [employer]  Tobacco Use   Smoking status: Former    Types: Cigarettes    Quit date: 07/26/1986    Years since quitting: 35.6   Smokeless tobacco: Never  Substance and Sexual Activity   Alcohol use: Yes    Comment: rarely   Drug use: No   Sexual activity: Not on file  Other Topics Concern   Not on file  Social History Narrative   divorced    no children    Social Determinants of Health   Financial Resource Strain: Not on file  Food Insecurity: Not on file  Transportation Needs: Not on file  Physical Activity: Not on file  Stress: Not on file  Social Connections: Not on file  Intimate Partner Violence: Not on file     Current Outpatient Medications  Medication Instructions    ACCU-CHEK FASTCLIX LANCETS MISC Use lancet to check blood sugar twice daily.    amLODipine (NORVASC) 10 MG tablet TAKE ONE TABLET BY MOUTH ONCE DAILY. patient needs to make an appointment for  further evaluation and/or laboratory testing before refills are given   Blood Glucose Monitoring Suppl (ACCU-CHEK GUIDE ME) w/Device KIT 1 each, Does not apply, 2 times daily, Use meter to check blood sugar 2 times daily.    cholecalciferol (VITAMIN D3) 1,000 Units, Oral, Daily   glucose blood (ACCU-CHEK GUIDE) test strip Use as instructed to check blood sugar 2 times daily.    metoprolol succinate (TOPROL-XL) 100 MG 24 hr tablet TAKE ONE TABLET BY MOUTH ONCE DAILY, TAKE WITH OR IMMEDIATELY FOLLOWING A MEAL   spironolactone (ALDACTONE) 50 mg, Oral, Daily       Objective:   Physical Exam BP 130/80   Pulse 77   Temp 98.3 F (36.8 C) (Oral)   Resp 18   Ht 5' 9" (1.753 m)   Wt 250 lb 6 oz (113.6 kg)   SpO2 97%   BMI 36.97 kg/m  General: Well developed, NAD, BMI noted Neck: No  thyromegaly    HEENT:  Normocephalic . Face symmetric, atraumatic Lungs:  CTA B Normal respiratory effort, no intercostal retractions, no accessory muscle use. Heart: RRR,  no murmur.  Abdomen:  Not distended, soft, non-tender. No rebound or rigidity.   Lower extremities: no pretibial edema bilaterally DRE: Normal sphincter tone, no stools, prostate normal Skin: Exposed areas without rash. Not pale. Not jaundice Neurologic:  alert & oriented X3.  Speech normal, gait appropriate for age and unassisted Strength symmetric and appropriate for age.  Psych: Cognition and judgment appear intact.  Cooperative with normal attention span and concentration.  Behavior appropriate. No anxious or depressed appearing.     Assessment     Assessment DM HTN Hypokalemia:Was evaluated by endocrinology, eval for hyperaldosteronism was nondiagnostic with negative urine aldosterone and CAT scans. Macular degeneration 2012 Kidney  stones Hearing loss (saw ENT 01/2020)  PLAN: Here for CPX DM: Currently diet controlled, check A1c, micro.  I explained the patient the meaning of a A1c, also he might need pharmacological therapy in the future. HTN: Continue amlodipine, metoprolol and Aldactone.  Checking labs. EKG: NSR, no acute changes.  Low voltages, probably related to BMI. Dyslipidemia: Patient has diabetes, recommend to start statins.  Also high triglyceride, he is fasting, checking labs. Obesity: Extensive discussion about weight loss.  Rec low carbohydrates diet. RTC 4 months.   

## 2022-02-25 ENCOUNTER — Encounter: Payer: Self-pay | Admitting: Internal Medicine

## 2022-02-25 LAB — COMPREHENSIVE METABOLIC PANEL
ALT: 68 U/L — ABNORMAL HIGH (ref 0–53)
AST: 53 U/L — ABNORMAL HIGH (ref 0–37)
Albumin: 4.5 g/dL (ref 3.5–5.2)
Alkaline Phosphatase: 60 U/L (ref 39–117)
BUN: 21 mg/dL (ref 6–23)
CO2: 26 mEq/L (ref 19–32)
Calcium: 9.2 mg/dL (ref 8.4–10.5)
Chloride: 98 mEq/L (ref 96–112)
Creatinine, Ser: 1.14 mg/dL (ref 0.40–1.50)
GFR: 70.71 mL/min (ref >60.00)
Glucose, Bld: 119 mg/dL — ABNORMAL HIGH (ref 70–99)
Potassium: 4.6 mEq/L (ref 3.5–5.1)
Sodium: 136 mEq/L (ref 135–145)
Total Bilirubin: 0.6 mg/dL (ref 0.2–1.2)
Total Protein: 6.8 g/dL (ref 6.0–8.3)

## 2022-02-25 LAB — MICROALBUMIN / CREATININE URINE RATIO
Creatinine,U: 220.7 mg/dL
Microalb Creat Ratio: 0.5 mg/g (ref 0.0–30.0)
Microalb, Ur: 1.2 mg/dL (ref 0.0–1.9)

## 2022-02-25 LAB — LDL CHOLESTEROL, DIRECT: Direct LDL: 64 mg/dL

## 2022-02-25 LAB — CBC WITH DIFFERENTIAL/PLATELET
Basophils Absolute: 0.1 10*3/uL (ref 0.0–0.1)
Basophils Relative: 1.1 % (ref 0.0–3.0)
Eosinophils Absolute: 0.1 10*3/uL (ref 0.0–0.7)
Eosinophils Relative: 1.4 % (ref 0.0–5.0)
HCT: 39.4 % (ref 39.0–52.0)
Hemoglobin: 13.5 g/dL (ref 13.0–17.0)
Lymphocytes Relative: 20.3 % (ref 12.0–46.0)
Lymphs Abs: 1.8 10*3/uL (ref 0.7–4.0)
MCHC: 34.1 g/dL (ref 30.0–36.0)
MCV: 87.5 fl (ref 78.0–100.0)
Monocytes Absolute: 0.8 10*3/uL (ref 0.1–1.0)
Monocytes Relative: 8.7 % (ref 3.0–12.0)
Neutro Abs: 6.1 10*3/uL (ref 1.4–7.7)
Neutrophils Relative %: 68.5 % (ref 43.0–77.0)
Platelets: 331 10*3/uL (ref 150.0–400.0)
RBC: 4.51 Mil/uL (ref 4.22–5.81)
RDW: 14.5 % (ref 11.5–15.5)
WBC: 9 10*3/uL (ref 4.0–10.5)

## 2022-02-25 LAB — PSA: PSA: 0.53 ng/mL (ref 0.10–4.00)

## 2022-02-25 LAB — LIPID PANEL
Cholesterol: 167 mg/dL (ref 0–200)
HDL: 30.8 mg/dL — ABNORMAL LOW (ref >39.00)
Total CHOL/HDL Ratio: 5
Triglycerides: 464 mg/dL — ABNORMAL HIGH (ref 0.0–149.0)

## 2022-02-25 LAB — TSH: TSH: 2.21 u[IU]/mL (ref 0.35–5.50)

## 2022-02-25 LAB — HEMOGLOBIN A1C: Hgb A1c MFr Bld: 8.5 % — ABNORMAL HIGH (ref 4.6–6.5)

## 2022-02-25 NOTE — Assessment & Plan Note (Signed)
Here for CPX DM: Currently diet controlled, check A1c, micro.  I explained the patient the meaning of a A1c, also he might need pharmacological therapy in the future. HTN: Continue amlodipine, metoprolol and Aldactone.  Checking labs. EKG: NSR, no acute changes.  Low voltages, probably related to BMI. Dyslipidemia: Patient has diabetes, recommend to start statins.  Also high triglyceride, he is fasting, checking labs. Obesity: Extensive discussion about weight loss.  Rec low carbohydrates diet. RTC 4 months

## 2022-02-25 NOTE — Assessment & Plan Note (Signed)
-  Td 11-2015 -COVID booster benefits discussed -Shingrix: Benefits discussed - Rec flu shot every fall -Colon cancer screening: Colonoscopy 09-2014, and 02/2019, next 5 years per GI letter. -Prostate cancer screening: DRE negative, checking a PSA - labs:  CMP, FLP, CBC, A1c, TSH, PSA -Diet exercise: Extensively discussed - POA information provided

## 2022-03-02 ENCOUNTER — Other Ambulatory Visit: Payer: Self-pay | Admitting: Internal Medicine

## 2022-03-02 MED ORDER — METFORMIN HCL 850 MG PO TABS: ORAL_TABLET | ORAL | 1 refills | Status: DC

## 2022-03-02 NOTE — Addendum Note (Signed)
Addended byDamita Dunnings D on: 03/02/2022 08:37 AM   Modules accepted: Orders

## 2022-03-12 ENCOUNTER — Encounter: Payer: Self-pay | Admitting: Internal Medicine

## 2022-04-15 ENCOUNTER — Encounter: Payer: Self-pay | Admitting: Internal Medicine

## 2022-04-15 LAB — HM DIABETES EYE EXAM

## 2022-06-25 ENCOUNTER — Ambulatory Visit: Payer: 59 | Admitting: Internal Medicine

## 2022-07-12 ENCOUNTER — Ambulatory Visit: Payer: PRIVATE HEALTH INSURANCE | Admitting: Internal Medicine

## 2022-07-12 ENCOUNTER — Encounter: Payer: Self-pay | Admitting: Internal Medicine

## 2022-07-12 VITALS — BP 132/80 | HR 83 | Temp 97.9°F | Resp 18 | Ht 69.0 in | Wt 247.4 lb

## 2022-07-12 DIAGNOSIS — E119 Type 2 diabetes mellitus without complications: Secondary | ICD-10-CM

## 2022-07-12 DIAGNOSIS — R7989 Other specified abnormal findings of blood chemistry: Secondary | ICD-10-CM

## 2022-07-12 DIAGNOSIS — Z23 Encounter for immunization: Secondary | ICD-10-CM | POA: Diagnosis not present

## 2022-07-12 DIAGNOSIS — E1169 Type 2 diabetes mellitus with other specified complication: Secondary | ICD-10-CM | POA: Diagnosis not present

## 2022-07-12 DIAGNOSIS — E785 Hyperlipidemia, unspecified: Secondary | ICD-10-CM

## 2022-07-12 MED ORDER — ACCU-CHEK GUIDE VI STRP: ORAL_STRIP | 12 refills | Status: AC

## 2022-07-12 MED ORDER — ACCU-CHEK FASTCLIX LANCETS MISC: 12 refills | Status: AC

## 2022-07-12 NOTE — Patient Instructions (Addendum)
Check the  blood pressure regularly BP GOAL is between 110/65 and  135/85. If it is consistently higher or lower, let me know      GO TO THE LAB : Get the blood work     [place], [place] back for checkup in 3 to 4 months

## 2022-07-12 NOTE — Progress Notes (Signed)
Subjective:    Patient ID: Cody Fox, male    DOB: [birth year], 61 y.o.   MRN: [id number]  DOS:  07/12/2022 Type of visit - description: f/u  Here for follow-up. At the last visit A1c was elevated, he is doing better with diet. Declined to take metformin Ambulatory CBGs went from "high" to the 120s and 90s when checked.  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    amLODipine (NORVASC) 10 mg, Oral, Daily   Blood Glucose Monitoring Suppl (ACCU-CHEK GUIDE ME) w/Device KIT 1 each, Does not apply, 2 times daily, Use meter to check blood sugar 2 times daily.    cholecalciferol (VITAMIN D3) 1,000 Units, Oral, Daily   glucose blood (ACCU-CHEK GUIDE) test strip Use as instructed to check blood sugar 2 times daily.    metFORMIN (GLUCOPHAGE) 850 MG tablet Take 1 tablet by mouth daily for 1 week, then increase to 1 tablet by mouth twice daily with meals   metoprolol succinate (TOPROL-XL) 100 mg, Oral, Daily   spironolactone (ALDACTONE) 50 mg, Oral, Daily       Objective:   Physical Exam BP 132/80   Pulse 83   Temp 97.9 F (36.6 C) (Oral)   Resp 18   Ht _0  (1.753 m)   Wt 247 lb 6 oz (112.2 kg)   SpO2 98%   BMI 36.53 kg/m  General:   Well developed, NAD, BMI noted. HEENT:  Normocephalic . Face symmetric, atraumatic Lungs:  CTA B Normal respiratory effort, no intercostal  retractions, no accessory muscle use. Heart: RRR,  no murmur.  Lower extremities: no pretibial edema bilaterally  Skin: Not pale. Not jaundice Neurologic:  alert & oriented X3.  Speech normal, gait appropriate for age and unassisted Psych--  Cognition and judgment appear intact.  Cooperative with normal attention span and concentration.  Behavior appropriate. No anxious or depressed appearing.      Assessment     Assessment DM HTN Hypokalemia:Was evaluated by endocrinology, eval for hyperaldosteronism was nondiagnostic with negative urine aldosterone and CAT scans. Macular degeneration 2012 Kidney stones Hearing loss (saw ENT 01/2020)  PLAN: DM: Last A1c 8.5, was Rx metformin, patient declined to take.  Doing better with portion control, making better food choices.  He is checking CBGs, they went from" high" to the 90s, 120s. Explained the patient the benefits of pharmacological therapy  ( metformin or newer medications). At this point he is quite reluctant to take any medication for diabetes, will check A1c.  Further advise w./  results. Dyslipidemia, last TG 464, she now has overt DM  thus definitely qualify for statins. Since LOV, has improved his diet.  He strongly declines any medication, explained the risk of dyslipidemia >>  strokes, CAD, etc.  He verbalized understanding and still declines. Increased LFTs: Modestly elevated LFTs few months ago, recheck today, fatty liver?. Preventive care: Flu shot today, encouraged to obtain a COVID-vaccine. RTC 3 to 4 months

## 2022-07-12 NOTE — Assessment & Plan Note (Signed)
DM: Last A1c 8.5, was Rx metformin, patient declined to take.  Doing better with portion control, making better food choices.  He is checking CBGs, they went from" high" to the 90s, 120s. Explained the patient the benefits of pharmacological therapy  ( metformin or newer medications). At this point he is quite reluctant to take any medication for diabetes, will check A1c.  Further advise w./  results. Dyslipidemia, last TG 464, she now has overt DM  thus definitely qualify for statins. Since LOV, has improved his diet.  He strongly declines any medication, explained the risk of dyslipidemia >>  strokes, CAD, etc.  He verbalized understanding and still declines. Increased LFTs: Modestly elevated LFTs few months ago, recheck today, fatty liver?. Preventive care: Flu shot today, encouraged to obtain a COVID-vaccine. RTC 3 to 4 months

## 2022-07-13 LAB — HEMOGLOBIN A1C: Hgb A1c MFr Bld: 8.6 % — ABNORMAL HIGH (ref 4.6–6.5)

## 2022-07-13 LAB — AST: AST: 35 U/L (ref 0–37)

## 2022-07-13 LAB — ALT: ALT: 52 U/L (ref 0–53)

## 2022-08-05 ENCOUNTER — Other Ambulatory Visit: Payer: Self-pay | Admitting: Internal Medicine

## 2022-12-21 ENCOUNTER — Other Ambulatory Visit (HOSPITAL_BASED_OUTPATIENT_CLINIC_OR_DEPARTMENT_OTHER): Payer: Self-pay

## 2022-12-21 ENCOUNTER — Encounter: Payer: Self-pay | Admitting: Internal Medicine

## 2022-12-21 ENCOUNTER — Ambulatory Visit (INDEPENDENT_AMBULATORY_CARE_PROVIDER_SITE_OTHER): Payer: PRIVATE HEALTH INSURANCE | Admitting: Internal Medicine

## 2022-12-21 VITALS — BP 126/76 | HR 68 | Temp 97.8°F | Resp 16 | Ht 69.0 in | Wt 245.5 lb

## 2022-12-21 DIAGNOSIS — E1169 Type 2 diabetes mellitus with other specified complication: Secondary | ICD-10-CM | POA: Diagnosis not present

## 2022-12-21 DIAGNOSIS — I10 Essential (primary) hypertension: Secondary | ICD-10-CM

## 2022-12-21 DIAGNOSIS — E785 Hyperlipidemia, unspecified: Secondary | ICD-10-CM

## 2022-12-21 DIAGNOSIS — E119 Type 2 diabetes mellitus without complications: Secondary | ICD-10-CM | POA: Diagnosis not present

## 2022-12-21 MED ORDER — COMIRNATY 30 MCG/0.3ML IM SUSY
0.3000 mL | PREFILLED_SYRINGE | Freq: Once | INTRAMUSCULAR | 0 refills | Status: AC
  Filled 2022-12-21: qty 0.3, 1d supply, fill #0

## 2022-12-21 NOTE — Progress Notes (Signed)
   Subjective:    Patient ID: Cody Fox, male    DOB: 07-Feb-1961, 62 y.o.   MRN: [id number]  DOS:  12/21/2022 Type of visit - description: f/u  Chronic medical problems were addressed. Feels well. Is eating a little healthier. Denies chest pain or difficulty breathing No nausea vomiting or diarrhea Ambulatory BPs never more than 140/83.  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    amLODipine (NORVASC) 10 mg, Oral, Daily   Blood Glucose Monitoring Suppl (ACCU-CHEK GUIDE ME) w/Device KIT 1 each, Does not apply, 2 times daily, Use meter to check blood sugar 2 times daily.    cholecalciferol (VITAMIN D3) 1,000 Units, Oral, Daily   COVID-19 mRNA vaccine 2023-2024 (COMIRNATY) syringe 0.3 mLs, Intramuscular,  Once   glucose blood (ACCU-CHEK GUIDE) test strip Use as instructed to check blood sugar 2 times daily.    metoprolol succinate (TOPROL-XL) 100 mg, Oral, Daily   spironolactone (ALDACTONE) 50 mg, Oral, Daily       Objective:   Physical Exam BP 126/76   Pulse 68   Temp 97.8 F (36.6 C) (Oral)   Resp 16   Ht 5\' 9"  (1.753 m)   Wt 245 lb 8 oz (111.4 kg)   SpO2 97%   BMI 36.25 kg/m  General:   Well developed, NAD, BMI noted. HEENT:  Normocephalic . Face symmetric, atraumatic Lungs:  CTA B Normal respiratory effort, no intercostal retractions, no accessory muscle  use. Heart: RRR,  no murmur.  DM foot exam: No edema, good pedal pulses, pinprick examination normal Skin: Not pale. Not jaundice Neurologic:  alert & oriented X3.  Speech normal, gait appropriate for age and unassisted Psych--  Cognition and judgment appear intact.  Cooperative with normal attention span and concentration.  Behavior appropriate. No anxious or depressed appearing.      Assessment     Assessment DM HTN Hypokalemia:Was evaluated by endocrinology, eval for hyperaldosteronism was nondiagnostic with negative urine aldosterone and CAT scans. Macular degeneration 2012 Kidney stones Hearing loss (saw ENT 01/2020)  PLAN: DM: Last A1c was 8.6. declined metformin, is doing better with diet, has cut down sugar intake.  Plans to join the gym pretty soon. We had extended discussion about risks of diabetes-high cholesterol -hypertension including early death, strokes and MI. I also discussed with him the concept of diabetes legacy.  Feet exam normal. Check a A1c, states will consider taking medication.   Hyperlipidemia: See above, he will consider medication HTN: On amlodipine, metoprolol and Aldactone.  Checking labs, ambulatory BP satisfactory. Vaccine advice provided.  See AVS. RTC 3 months

## 2022-12-21 NOTE — Patient Instructions (Addendum)
Vaccines I recommend: Covid booster Shingrix (shingles)  Check the  blood pressure regularly BP GOAL is between 110/65 and  135/85. If it is consistently higher or lower, let me know     GO TO THE LAB : Get the blood work     GO TO THE FRONT DESK, PLEASE SCHEDULE YOUR APPOINTMENTS Come back for   a checkup in 3 months

## 2022-12-21 NOTE — Assessment & Plan Note (Signed)
DM: Last A1c was 8.6. declined metformin, is doing better with diet, has cut down sugar intake.  Plans to join the gym pretty soon. We had extended discussion about risks of diabetes-high cholesterol -hypertension including early death, strokes and MI. I also discussed with him the concept of diabetes legacy.  Feet exam normal. Check a A1c, states will consider taking medication.   Hyperlipidemia: See above, he will consider medication HTN: On amlodipine, metoprolol and Aldactone.  Checking labs, ambulatory BP satisfactory. Vaccine advice provided.  See AVS. RTC 3 months

## 2022-12-22 LAB — CBC WITH DIFFERENTIAL/PLATELET
Basophils Absolute: 0.1 10*3/uL (ref 0.0–0.1)
Basophils Relative: 1.3 % (ref 0.0–3.0)
Eosinophils Absolute: 0.2 10*3/uL (ref 0.0–0.7)
Eosinophils Relative: 1.7 % (ref 0.0–5.0)
HCT: 42.5 % (ref 39.0–52.0)
Hemoglobin: 14.3 g/dL (ref 13.0–17.0)
Lymphocytes Relative: 18.6 % (ref 12.0–46.0)
Lymphs Abs: 1.8 10*3/uL (ref 0.7–4.0)
MCHC: 33.6 g/dL (ref 30.0–36.0)
MCV: 89 fl (ref 78.0–100.0)
Monocytes Absolute: 0.8 10*3/uL (ref 0.1–1.0)
Monocytes Relative: 7.8 % (ref 3.0–12.0)
Neutro Abs: 7 10*3/uL (ref 1.4–7.7)
Neutrophils Relative %: 70.6 % (ref 43.0–77.0)
Platelets: 388 10*3/uL (ref 150.0–400.0)
RBC: 4.77 Mil/uL (ref 4.22–5.81)
RDW: 14.4 % (ref 11.5–15.5)
WBC: 9.9 10*3/uL (ref 4.0–10.5)

## 2022-12-22 LAB — LIPID PANEL
Cholesterol: 179 mg/dL (ref 0–200)
HDL: 31.2 mg/dL — ABNORMAL LOW (ref >39.00)
Total CHOL/HDL Ratio: 6
Triglycerides: 477 mg/dL — ABNORMAL HIGH (ref 0.0–149.0)

## 2022-12-22 LAB — COMPREHENSIVE METABOLIC PANEL
ALT: 53 U/L (ref 0–53)
AST: 41 U/L — ABNORMAL HIGH (ref 0–37)
Albumin: 4.2 g/dL (ref 3.5–5.2)
Alkaline Phosphatase: 56 U/L (ref 39–117)
BUN: 18 mg/dL (ref 6–23)
CO2: 27 mEq/L (ref 19–32)
Calcium: 9.2 mg/dL (ref 8.4–10.5)
Chloride: 99 mEq/L (ref 96–112)
Creatinine, Ser: 1.04 mg/dL (ref 0.40–1.50)
GFR: 78.5 mL/min (ref >60.00)
Glucose, Bld: 121 mg/dL — ABNORMAL HIGH (ref 70–99)
Potassium: 4.4 mEq/L (ref 3.5–5.1)
Sodium: 135 mEq/L (ref 135–145)
Total Bilirubin: 0.5 mg/dL (ref 0.2–1.2)
Total Protein: 6.8 g/dL (ref 6.0–8.3)

## 2022-12-22 LAB — HEMOGLOBIN A1C: Hgb A1c MFr Bld: 9 % — ABNORMAL HIGH (ref 4.6–6.5)

## 2022-12-22 LAB — LDL CHOLESTEROL, DIRECT: Direct LDL: 82 mg/dL

## 2022-12-23 MED ORDER — METFORMIN HCL ER 500 MG PO TB24: ORAL_TABLET | ORAL | 0 refills | Status: DC

## 2022-12-23 NOTE — Addendum Note (Signed)
Addended by: Conrad [place] D on: 12/23/2022 09:40 AM   Modules accepted: Orders

## 2023-01-19 ENCOUNTER — Other Ambulatory Visit: Payer: Self-pay | Admitting: Internal Medicine

## 2023-01-20 ENCOUNTER — Telehealth: Payer: Self-pay | Admitting: Internal Medicine

## 2023-01-20 MED ORDER — SPIRONOLACTONE 50 MG PO TABS: 50.0000 mg | ORAL_TABLET | Freq: Every day | ORAL | 1 refills | Status: DC

## 2023-01-20 MED ORDER — METOPROLOL SUCCINATE ER 100 MG PO TB24: 100.0000 mg | ORAL_TABLET | Freq: Every day | ORAL | 1 refills | Status: DC

## 2023-01-20 NOTE — Telephone Encounter (Signed)
Rxs sent

## 2023-01-20 NOTE — Telephone Encounter (Signed)
[facility] called stating that they had received a error regarding pt's e-scribe for metoprolol and spironolactone and needed to have those resent when possible.

## 2023-01-20 NOTE — Addendum Note (Signed)
Addended byConrad [place] D on: 01/20/2023 09:25 AM   Modules accepted: Orders

## 2023-01-31 ENCOUNTER — Ambulatory Visit
Admission: EM | Admit: 2023-01-31 | Discharge: 2023-01-31 | Disposition: A | Discharge status: Home / Self Care | Payer: PRIVATE HEALTH INSURANCE | Attending: Internal Medicine | Admitting: Internal Medicine

## 2023-01-31 DIAGNOSIS — W57XXXA Bitten or stung by nonvenomous insect and other nonvenomous arthropods, initial encounter: Secondary | ICD-10-CM

## 2023-01-31 DIAGNOSIS — N4822 Cellulitis of corpus cavernosum and penis: Secondary | ICD-10-CM | POA: Diagnosis not present

## 2023-01-31 DIAGNOSIS — S30862A Insect bite (nonvenomous) of penis, initial encounter: Secondary | ICD-10-CM

## 2023-01-31 MED ORDER — DOXYCYCLINE HYCLATE 100 MG PO CAPS
100.0000 mg | ORAL_CAPSULE | Freq: Two times a day (BID) | ORAL | 0 refills | Status: DC
Start: 2023-01-31 — End: 2023-02-15

## 2023-01-31 NOTE — Discharge Instructions (Signed)
Start doxycycline twice daily for 10 days.  Cool compresses to the area to help with swelling.  Please follow-up with your PCP at your scheduled appointment tomorrow for recheck.  Please go to the emergency room if you develop any worsening symptoms.  This includes but is not limited to fevers or chills, worsening swelling, inability to urinate, or any new concerns that arise

## 2023-01-31 NOTE — ED Provider Notes (Addendum)
[facility]    CSN: [id number] Arrival date & time: 01/31/23  1529      History   Chief Complaint No chief complaint on file.   HPI Cody Fox is a 62 y.o. male presents for evaluation of a tick bite.  Patient reports Saturday he removed a tick from the distal shaft of his penis.  States it was not engorged believes it was not attached more than a few hours.  He did mow the grass that day and thinks that is where it came from.  He states since that has been having some swelling where the bite was.  Denies any difficulty urinating, no fevers or chills.  No rashes.  No testicular pain or swelling.  He has not used any OTC medications for symptoms.  No other concerns at this time.  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 Authorizing Provider  doxycycline (VIBRAMYCIN) 100 MG capsule Take 1 capsule (100 mg total) by mouth 2 (two) times daily. 01/31/23  Yes Radford Pax, NP  Accu-Chek FastClix Lancets MISC Use lancet to check blood sugar twice daily. 07/12/22   Wanda Plump, MD  amLODipine (NORVASC) 10 MG tablet Take 1 tablet (10 mg total) by mouth daily. 08/05/22   Wanda Plump, MD   Blood Glucose Monitoring Suppl (ACCU-CHEK GUIDE ME) w/Device KIT 1 each by Does not apply route 2 (two) times daily. Use meter to check blood sugar 2 times daily. 09/19/18   Reather Littler, MD  cholecalciferol (VITAMIN D3) 25 MCG (1000 UT) tablet Take 1,000 Units by mouth daily.    [provider]  glucose blood (ACCU-CHEK GUIDE) test strip Use as instructed to check blood sugar 2 times daily. 07/12/22   Wanda Plump, MD  metFORMIN (GLUCOPHAGE-XR) 500 MG 24 hr tablet Take 1 tablet by mouth daily for 5 days, then 1 tablet by mouth twice daily for 5 days, then 2 tablets by mouth daily with breakfast and 1 tablet by mouth daily with dinner 12/23/22   Wanda Plump, MD  metoprolol succinate (TOPROL-XL) 100 MG 24 hr tablet Take 1 tablet (100 mg total) by mouth daily. Take with or immediately following a meal 01/20/23   Wanda Plump, MD  spironolactone (ALDACTONE) 50 MG tablet Take 1 tablet (50 mg total) by mouth daily. 01/20/23   Wanda Plump, MD    Family History Family History  Problem Relation Age of Onset   Hypertension Father    Hypertension Paternal Uncle    Coronary artery disease Neg Hx    Cancer Neg Hx  prostate or colon   Diabetes Neg Hx    Colon cancer Neg Hx    Stroke Neg Hx    Esophageal cancer Neg Hx    Rectal cancer Neg Hx    Stomach cancer Neg Hx     Social History Social History   Tobacco Use   Smoking status: Former    Types: Cigarettes    Quit date: 07/26/1986    Years since quitting: 36.5   Smokeless tobacco: Never  Substance Use Topics   Alcohol use: Yes    Comment: rarely   Drug use: No     Allergies   Patient has no known allergies.   Review of Systems Review of Systems  Genitourinary:  Positive for penile swelling.     Physical Exam Triage Vital Signs ED Triage Vitals [01/31/23 1631]  Enc Vitals Group     BP 127/84     Pulse Rate 79     Resp 16     Temp 98.5 F (36.9 C)     Temp Source Oral     SpO2 96 %     Weight      Height       Head Circumference      Peak Flow      Pain Score      Pain Loc      Pain Edu?      Excl. in GC?    No data found.  Updated Vital Signs BP 127/84 (BP Location: Left Arm)   Pulse 79   Temp 98.5 F (36.9 C) (Oral)   Resp 16   SpO2 96%   Visual Acuity Right Eye Distance:   Left Eye Distance:   Bilateral Distance:    Right Eye Near:   Left Eye Near:    Bilateral Near:     Physical Exam Vitals and nursing note reviewed. Exam conducted with a chaperone present (Adriana MA).  Constitutional:      General: He is not in acute distress.    Appearance: Normal appearance. He is not ill-appearing.  HENT:     Head: Normocephalic and atraumatic.  Eyes:     Pupils: Pupils are equal, round, and reactive to light.  Cardiovascular:     Rate and Rhythm: Normal rate.  Pulmonary:     Effort: Pulmonary effort is normal.  Genitourinary:    Penis: Circumcised. Swelling present. No paraphimosis, hypospadias, erythema or discharge.        Comments: Mild to moderate swelling of the distal penile shaft primarily on the left.  Glands not involved.  Mild erythema.  No rashes or lesions. Skin:    General: Skin is warm and dry.  Neurological:     General: No focal deficit present.     Mental Status: He is alert and oriented to person, place, and time.  Psychiatric:        Mood and Affect: Mood normal.        Behavior: Behavior normal.      UC Treatments / Results  Labs (all labs ordered are listed, but only abnormal results are displayed) Labs Reviewed - No data to display  Comp Met (CMET) Order: [id number] Status: Final result     Visible to patient: Yes (seen)     Next appt: 02/01/2023 at 01:45 PM in [facility], [place])     Dx: Primary hypertension   2 Result Notes     1 Follow-up Encounter     1 HM Topic  Component Ref Range & Units 1 mo ago (12/21/22) 6 mo ago (07/12/22) 6 mo ago (07/12/22) 11 mo ago (02/24/22) 2 yr ago (12/23/20) 3 yr  ago (12/25/19) 4 yr ago (12/28/18)  Sodium 135 - 145 mEq/L 135   136 137 135 140  Potassium 3.5 - 5.1 mEq/L 4.4   4.6 4.4 4.8 4.3  Chloride 96 - 112 mEq/L 99   98 102 101 106  CO2 19 - 32 mEq/L 27   26 26 27 27   Glucose, Bld 70 - 99 mg/dL 478 High    295 High  92 80 120 High   BUN 6 - 23 mg/dL 18   21 30  High  24 High  29 High   Creatinine, Ser 0.40 - 1.50 mg/dL 6.21   3.08 6.57 8.46 9.62  Total Bilirubin 0.2 - 1.2 mg/dL 0.5   0.6 0.4 0.6   Alkaline Phosphatase 39 - 117 U/L 56   60 55 52   AST 0 - 37 U/L 41 High   35 53 High  25 18   ALT 0 - 53 U/L 53 52  68 High  36 17   Total Protein 6.0 - 8.3 g/dL 6.8   6.8 6.7 6.7   Albumin 3.5 - 5.2 g/dL 4.2   4.5 4.5 4.5   GFR >60.00 mL/min 78.50   70.71 CM 65.09 CM 63.08 66.52  Comment: Calculated using the CKD-EPI Creatinine Equation (2021)  Calcium 8.4 - 10.5 mg/dL 9.2   9.2 95.2 9.6 9.4  Resulting Agency [place] [facility] [place] [facility] [place] [facility] [place] [facility] [place] [facility] [place] [facility] [place] [facility]         Specimen Collected: 12/21/22 14:10 Last Resulted: 12/22/22 11:06        EKG   Radiology No results found.  Procedures Procedures (including critical care time)  Medications Ordered in UC Medications - No data to display  Initial Impression / Assessment and Plan / UC Course  I have reviewed the triage vital signs and the nursing notes.  Pertinent labs & imaging results that were available during my care of the patient were reviewed by me and considered in my medical decision making (see chart for details).     Will start doxycycline twice daily for 10 days to cover tick bite.  Patient has appoint with his PCP tomorrow and will follow-up then.  Discussed swelling and using cool compresses.  Advised to go to the ER ASAP for any worsening symptoms that occur prior to him seeing his PCP tomorrow which includes fevers or chills or any worsening swelling of the affected area.  Patient verbalized  understanding. Final Clinical Impressions(s) / UC Diagnoses   Final diagnoses:  Tick bite of penis, initial encounter  Cellulitis of penis     Discharge Instructions      Start doxycycline twice daily for 10 days.  Cool compresses to the area to help with swelling.  Please follow-up with your PCP at your scheduled appointment tomorrow for recheck.  Please go to the emergency room if you develop any worsening symptoms.  This includes but is not limited to fevers or chills, worsening swelling, inability to urinate, or any new concerns that arise    ED Prescriptions     Medication Sig Dispense Auth. Provider   doxycycline (VIBRAMYCIN) 100 MG capsule Take 1 capsule (100 mg total) by mouth 2 (two) times daily. 20 capsule Radford Pax, NP      PDMP not reviewed this encounter.  Radford Pax, NP 01/31/23 1656    Radford Pax, NP 01/31/23 [phone]

## 2023-01-31 NOTE — ED Triage Notes (Signed)
Pt presents to UC w/ c/o tick bite on genital area. Pt removed the tick 3 days ago and the area has swollen. Denies drainage or fevers. Pt has tried applying antibiotic ointment and cleaned with alcohol without relief.

## 2023-02-01 ENCOUNTER — Ambulatory Visit (INDEPENDENT_AMBULATORY_CARE_PROVIDER_SITE_OTHER): Payer: PRIVATE HEALTH INSURANCE | Admitting: Family Medicine

## 2023-02-01 VITALS — BP 120/78 | HR 80 | Temp 97.9°F | Ht 69.0 in | Wt 243.1 lb

## 2023-02-01 DIAGNOSIS — W57XXXA Bitten or stung by nonvenomous insect and other nonvenomous arthropods, initial encounter: Secondary | ICD-10-CM | POA: Diagnosis not present

## 2023-02-01 DIAGNOSIS — S30862A Insect bite (nonvenomous) of penis, initial encounter: Secondary | ICD-10-CM

## 2023-02-01 MED ORDER — MELOXICAM 15 MG PO TABS: 15.0000 mg | ORAL_TABLET | Freq: Every day | ORAL | 0 refills | Status: DC

## 2023-02-01 MED ORDER — MELOXICAM 15 MG PO TABS
15.0000 mg | ORAL_TABLET | Freq: Every day | ORAL | 0 refills | Status: DC
Start: 2023-02-01 — End: 2023-02-01

## 2023-02-01 NOTE — Progress Notes (Signed)
Chief Complaint  Patient presents with   Follow-up    Tick bite Sick from the antibiotic    [name] is a 62 y.o. male here for a skin complaint.  Duration: 3 days Location: L portion of penile shaft Pruritic? Yes Painful? No Drainage? 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 Edema noted of L scrotal region with some erythema and ttp. Mild edema over distal shaft of penis circumferentially around, more prominent on the L. No drainage, scaling, fluctuance, excoriation Psych: Age appropriate judgment and insight  Tick bite of penis, initial encounter  Cont doxycycline for full course. Despite swelling, he is noting some improvement. Add Mobic 15 mg/d for 7 d and then daily prn after that. Ice, Tylenol. ER if worsening. F/u prn. The patient voiced understanding and agreement to the plan.  Jilda Roche [place], DO 02/01/23 1:45 PM

## 2023-02-01 NOTE — Patient Instructions (Addendum)
If things worsen, please go to the ER.  Take the meloxicam/Mobic daily for the next 7 days and then daily as needed.  Finish the doxycycline.   Ice/cold pack over area for 10-15 min twice daily.  OK to take Tylenol 1000 mg (2 extra strength tabs) or 975 mg (3 regular strength tabs) every 6 hours as needed.  Let us know if you need anything.

## 2023-02-01 NOTE — Addendum Note (Signed)
Addended by: Thelma Barge D on: 02/01/2023 02:35 PM   Modules accepted: Orders

## 2023-02-15 ENCOUNTER — Ambulatory Visit (INDEPENDENT_AMBULATORY_CARE_PROVIDER_SITE_OTHER): Payer: PRIVATE HEALTH INSURANCE | Admitting: Internal Medicine

## 2023-02-15 ENCOUNTER — Encounter: Payer: Self-pay | Admitting: Internal Medicine

## 2023-02-15 VITALS — BP 126/76 | HR 77 | Temp 98.3°F | Resp 18 | Ht 69.0 in | Wt 245.2 lb

## 2023-02-15 DIAGNOSIS — E119 Type 2 diabetes mellitus without complications: Secondary | ICD-10-CM | POA: Diagnosis not present

## 2023-02-15 DIAGNOSIS — Z7984 Long term (current) use of oral hypoglycemic drugs: Secondary | ICD-10-CM

## 2023-02-15 NOTE — Patient Instructions (Addendum)
Vaccines I recommend: Shingrix (shingles) Flu shot this fall    Check the  blood pressure regularly BP GOAL is between 110/65 and  135/85. If it is consistently higher or lower, let me know      GO TO THE LAB : Get the blood work     GO TO THE FRONT DESK, PLEASE SCHEDULE YOUR APPOINTMENTS Come back for   thisa check up in 3 months

## 2023-02-15 NOTE — Progress Notes (Signed)
Subjective:    Patient ID: Cody Fox, male    DOB: 1960/10/25, 62 y.o.   MRN: [id number]  DOS:  02/15/2023 Type of visit - description: Follow-up  Here for diabetes management. Has no other major concerns.  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    amLODipine (NORVASC) 10 mg, Oral, Daily   Blood Glucose Monitoring Suppl (ACCU-CHEK GUIDE ME) w/Device KIT 1 each, Does not apply, 2 times daily, Use meter to check blood sugar 2 times daily.    doxycycline (VIBRAMYCIN) 100 mg, Oral, 2 times daily   glucose blood (ACCU-CHEK GUIDE) test strip Use as instructed to check blood sugar 2 times daily.    meloxicam (MOBIC) 15 mg, Oral, Daily   metFORMIN (GLUCOPHAGE-XR) 500 MG 24 hr tablet Take 1 tablet by mouth daily for 5 days, then 1 tablet by mouth twice daily for 5 days, then 2 tablets by mouth daily with breakfast and 1 tablet by mouth daily with dinner   metoprolol succinate (TOPROL-XL) 100 mg, Oral, Daily, Take with or immediately following a meal   spironolactone (ALDACTONE) 50 mg, Oral, Daily       Objective:   Physical Exam BP 126/76   Pulse 77   Temp 98.3 F (36.8 C) (Oral)   Resp 18   Ht 5\' 9"  (1.753 m)   Wt 245 lb 4 oz (111.2 kg)   SpO2 95%   BMI 36.22 kg/m  General:   Well developed, NAD, BMI noted. HEENT:  Normocephalic . Face symmetric, atraumatic Skin: Not pale.  Not jaundice Neurologic:  alert & oriented X3.  Speech normal, gait appropriate for age and unassisted Psych--  Cognition and judgment appear intact.  Cooperative with normal attention span and concentration.  Behavior appropriate. No anxious or depressed appearing.      Assessment    Assessment DM HTN Hypokalemia:Was evaluated by endocrinology, eval for hyperaldosteronism was nondiagnostic with negative urine aldosterone and CAT scans. Macular degeneration 2012 Kidney stones Hearing loss (saw ENT 01/2020)  PLAN: DM:   DM dx 07-2018, was diet controlled for a while, then A1c started to increase. Last A1c was 9.0, he agreed to start metformin. Not making much progress with diet, no ambulatory CBGs.  Complaining of some nausea since he started metformin. We had a very long discussion about the roles of diet, exercise and genetics on his blood sugar. States that he knows how to eat, saw a nutritionist last year.  Nevertheless we talk about the need to decrease CHO intake and increase physical activity. Having some nausea with   metformin XR 500 so we will space it out throughout the day (take TID), check A1c He will not consider injectable but perhaps another oral. Tick bite: Since the LOV, went to urgent  care and saw Dr. Myrtie Neither antibiotics.  No further problems. RTC 3 months

## 2023-02-16 LAB — MICROALBUMIN / CREATININE URINE RATIO
Creatinine,U: 193.4 mg/dL
Microalb Creat Ratio: 0.4 mg/g (ref 0.0–30.0)
Microalb, Ur: <0.7 mg/dL (ref 0.0–1.9)

## 2023-02-16 LAB — HEMOGLOBIN A1C: Hgb A1c MFr Bld: 8.8 % — ABNORMAL HIGH (ref 4.6–6.5)

## 2023-02-16 NOTE — Assessment & Plan Note (Signed)
DM:   DM dx 07-2018, was diet controlled for a while, then A1c started to increase. Last A1c was 9.0, he agreed to start metformin. Not making much progress with diet, no ambulatory CBGs.  Complaining of some nausea since he started metformin. We had a very long discussion about the roles of diet, exercise and genetics on his blood sugar. States that he knows how to eat, saw a nutritionist last year.  Nevertheless we talk about the need to decrease CHO intake and increase physical activity. Having some nausea with   metformin XR 500 so we will space it out throughout the day (take TID), check A1c He will not consider injectable but perhaps another oral. Tick bite: Since the LOV, went to urgent care and saw Dr. Myrtie Neither antibiotics.  No further problems. RTC 3 months

## 2023-02-21 ENCOUNTER — Other Ambulatory Visit: Payer: Self-pay | Admitting: Internal Medicine

## 2023-02-21 MED ORDER — RYBELSUS 3 MG PO TABS: 3.0000 mg | ORAL_TABLET | Freq: Every day | ORAL | 0 refills | Status: DC

## 2023-02-21 NOTE — Addendum Note (Signed)
Addended byConrad  D on: 02/21/2023 07:43 AM   Modules accepted: Orders

## 2023-03-30 ENCOUNTER — Other Ambulatory Visit: Payer: Self-pay | Admitting: Internal Medicine

## 2023-04-16 ENCOUNTER — Other Ambulatory Visit: Payer: Self-pay | Admitting: Family Medicine

## 2023-04-18 ENCOUNTER — Other Ambulatory Visit: Payer: Self-pay | Admitting: Family Medicine

## 2023-04-18 ENCOUNTER — Telehealth: Payer: Self-pay | Admitting: Internal Medicine

## 2023-04-19 ENCOUNTER — Other Ambulatory Visit: Payer: Self-pay | Admitting: Family

## 2023-04-19 MED ORDER — RYBELSUS 3 MG PO TABS: 1.0000 | ORAL_TABLET | Freq: Every day | ORAL | 2 refills | Status: DC

## 2023-04-19 NOTE — Telephone Encounter (Signed)
Pt has completed Rybelsus 3mg  for 1 month, increase to Rybelsus 7mg ?

## 2023-04-20 ENCOUNTER — Other Ambulatory Visit (HOSPITAL_COMMUNITY): Payer: Self-pay

## 2023-04-20 ENCOUNTER — Other Ambulatory Visit: Payer: Self-pay | Admitting: Internal Medicine

## 2023-04-20 MED ORDER — RYBELSUS 7 MG PO TABS
7.0000 mg | ORAL_TABLET | Freq: Every day | ORAL | 1 refills | Status: DC
  Filled 2023-04-20: qty 90, 90d supply, fill #0

## 2023-04-20 MED ORDER — RYBELSUS 7 MG PO TABS
7.0000 mg | ORAL_TABLET | Freq: Every day | ORAL | 1 refills | Status: DC
  Filled 2023-04-20 – 2023-05-13 (×2): qty 90, 90d supply, fill #0

## 2023-05-13 ENCOUNTER — Other Ambulatory Visit: Payer: Self-pay

## 2023-05-13 ENCOUNTER — Other Ambulatory Visit (HOSPITAL_COMMUNITY): Payer: Self-pay

## 2023-05-18 ENCOUNTER — Ambulatory Visit: Payer: PRIVATE HEALTH INSURANCE | Admitting: Internal Medicine

## 2023-05-23 ENCOUNTER — Other Ambulatory Visit (HOSPITAL_COMMUNITY): Payer: Self-pay

## 2023-06-03 ENCOUNTER — Encounter: Payer: Self-pay | Admitting: Internal Medicine

## 2023-06-03 ENCOUNTER — Telehealth: Payer: Self-pay

## 2023-06-03 ENCOUNTER — Ambulatory Visit (INDEPENDENT_AMBULATORY_CARE_PROVIDER_SITE_OTHER): Payer: PRIVATE HEALTH INSURANCE | Admitting: Internal Medicine

## 2023-06-03 VITALS — BP 126/80 | HR 77 | Temp 97.7°F | Resp 16 | Ht 69.0 in | Wt 243.5 lb

## 2023-06-03 DIAGNOSIS — E119 Type 2 diabetes mellitus without complications: Secondary | ICD-10-CM | POA: Diagnosis not present

## 2023-06-03 DIAGNOSIS — Z7984 Long term (current) use of oral hypoglycemic drugs: Secondary | ICD-10-CM

## 2023-06-03 DIAGNOSIS — Z23 Encounter for immunization: Secondary | ICD-10-CM

## 2023-06-03 DIAGNOSIS — I10 Essential (primary) hypertension: Secondary | ICD-10-CM | POA: Diagnosis not present

## 2023-06-03 LAB — HM DIABETES EYE EXAM

## 2023-06-03 MED ORDER — RYBELSUS 3 MG PO TABS: 3.0000 mg | ORAL_TABLET | Freq: Every day | ORAL | 0 refills | Status: DC

## 2023-06-03 NOTE — Telephone Encounter (Signed)
PA initiated via Covermymeds; KEY: [id number]. Awaiting determination.

## 2023-06-03 NOTE — Progress Notes (Unsigned)
   Subjective:    Patient ID: Cody Fox, male    DOB: [birth year], 62 y.o.   MRN: [id number]  DOS:  06/03/2023 Type of visit - description: f/u  Here for diabetes management. Since the last visit he has changed his diet, eating much healthier, ambulatory CBGs decreasing.  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    amLODipine (NORVASC) 10 mg, Oral, Daily   Blood Glucose Monitoring Suppl (ACCU-CHEK GUIDE ME) w/Device KIT 1 each, Does not apply, 2 times daily, Use meter to check blood sugar 2 times daily.    glucose blood (ACCU-CHEK GUIDE) test strip Use as instructed to check blood sugar 2 times daily.    metFORMIN (GLUCOPHAGE-XR) 500 MG 24 hr tablet Take 2 tablets (1,000 mg total) by mouth in the morning AND 1 tablet (500 mg total) every evening.   metoprolol succinate (TOPROL-XL) 100 mg, Oral, Daily, Take with or immediately following a meal   Rybelsus 7 mg, Oral, Daily   spironolactone (ALDACTONE) 50 mg, Oral, Daily       Objective:   Physical Exam BP 126/80   Pulse 77   Temp 97.7 F (36.5 C) (Oral)   Resp 16   Ht 5\' 9"  (1.753 m)   Wt 243 lb 8 oz (110.5 kg)   SpO2 98%   BMI 35.96 kg/m  General:   Well developed, NAD, BMI noted. HEENT:  Normocephalic . Face symmetric, atraumatic Lungs:  CTA B Normal respiratory effort, no intercostal retractions, no accessory muscle  use. Heart: RRR,  no murmur.  Lower extremities: no pretibial edema bilaterally  Skin: Not pale. Not jaundice Neurologic:  alert & oriented X3.  Speech normal, gait appropriate for age and unassisted Psych--  Cognition and judgment appear intact.  Cooperative with normal attention span and concentration.  Behavior appropriate. No anxious or depressed appearing.      Assessment    Assessment DM HTN Hypokalemia:Was evaluated by endocrinology, eval for hyperaldosteronism was nondiagnostic with negative urine aldosterone and CAT scans. Macular degeneration 2012 Kidney stones Hearing loss (saw ENT 01/2020)  PLAN: DM: Diet has improved, less, controlling portions better.  Ambulatory CBG gradually decreasing, currently range from 120-1 50. Rybelsus was recommended never got it d/t a issue w/ the authorization from insurance. Good compliance with metformin, 2 tablets in the morning and 1 in the afternoon. Plan: A1c, will try to get the PA for Rybelsus approved. HTN: BP looks good, on amlodipine, Metroprolol, spironolactone, last BMP okay. Vaccine advice: Flu shot today. Had a COVID-vaccine just few months ago, plans to proceed in a couple of months with another COVID booster. RTC 3 months

## 2023-06-03 NOTE — Patient Instructions (Signed)
     GO TO THE LAB : Get the blood work     Next visit with me in 3 months Please schedule it at the front desk

## 2023-06-04 LAB — HEMOGLOBIN A1C
Hgb A1c MFr Bld: 8.7 %{Hb} — ABNORMAL HIGH (ref <5.7)
Mean Plasma Glucose: 203 mg/dL
eAG (mmol/L): 11.2 mmol/L

## 2023-06-04 NOTE — Assessment & Plan Note (Signed)
DM: Diet has improved, less, controlling portions better.  Ambulatory CBG gradually decreasing, currently range from 120-1 50. Rybelsus was recommended never got it d/t a issue w/ the authorization from insurance. Good compliance with metformin, 2 tablets in the morning and 1 in the afternoon. Plan: A1c, will try to get the PA for Rybelsus approved. HTN: BP looks good, on amlodipine, Metroprolol, spironolactone, last BMP okay. Vaccine advice: Flu shot today. Had a COVID-vaccine just few months ago, plans to proceed in a couple of months with another COVID booster. RTC 3 months

## 2023-06-08 NOTE — Telephone Encounter (Signed)
Received fax from [employer], Rybelsus is covered under benefit plan, no PA required.

## 2023-06-24 ENCOUNTER — Other Ambulatory Visit: Payer: Self-pay | Admitting: Internal Medicine

## 2023-06-24 ENCOUNTER — Other Ambulatory Visit (HOSPITAL_COMMUNITY): Payer: Self-pay

## 2023-07-08 ENCOUNTER — Telehealth: Payer: Self-pay | Admitting: Internal Medicine

## 2023-07-08 ENCOUNTER — Other Ambulatory Visit: Payer: Self-pay | Admitting: Internal Medicine

## 2023-07-08 MED ORDER — RYBELSUS 7 MG PO TABS: 7.0000 mg | ORAL_TABLET | Freq: Every day | ORAL | 0 refills | Status: DC

## 2023-07-08 NOTE — Telephone Encounter (Signed)
PA initiated via Covermymeds; KEY: [id number]. Awaiting determination.

## 2023-07-12 NOTE — Telephone Encounter (Signed)
Received response from [employer]- PA not needed.

## 2023-07-12 NOTE — Telephone Encounter (Signed)
Can you guys try to run a test claim for me? I sent a PA on 07/08/23 but have not received a response. Thank you.

## 2023-07-15 ENCOUNTER — Telehealth: Payer: Self-pay

## 2023-07-15 ENCOUNTER — Other Ambulatory Visit (HOSPITAL_COMMUNITY): Payer: Self-pay

## 2023-07-15 NOTE — Telephone Encounter (Signed)
Noted, Rx resent w/ dx code.

## 2023-07-15 NOTE — Telephone Encounter (Signed)
Apologies for the delay, noted. I still ran the test claim, it looks like Rybelsus is covered without a PA, however the pharmacy has to enter the ICD10 code in at pharmacy level. If pharmacy doesn't know how, they will likely have to call pt's insurance or their system help desk.

## 2023-07-15 NOTE — Telephone Encounter (Signed)
Received request for test claim, PA not needed, ran test claim to see if pt is able to get Rybelsus/what prompted PA to be requested.  PA not required, ICD code must be entered at pharmacy level, pt picked up Rybelsus recently.

## 2023-08-31 ENCOUNTER — Encounter: Payer: Self-pay | Admitting: Internal Medicine

## 2023-08-31 ENCOUNTER — Ambulatory Visit (INDEPENDENT_AMBULATORY_CARE_PROVIDER_SITE_OTHER): Payer: PRIVATE HEALTH INSURANCE | Admitting: Internal Medicine

## 2023-08-31 VITALS — BP 126/82 | HR 75 | Ht 69.0 in | Wt 243.2 lb

## 2023-08-31 DIAGNOSIS — E119 Type 2 diabetes mellitus without complications: Secondary | ICD-10-CM

## 2023-08-31 DIAGNOSIS — Z7984 Long term (current) use of oral hypoglycemic drugs: Secondary | ICD-10-CM | POA: Diagnosis not present

## 2023-08-31 DIAGNOSIS — I10 Essential (primary) hypertension: Secondary | ICD-10-CM | POA: Diagnosis not present

## 2023-08-31 NOTE — Patient Instructions (Addendum)
 Proceed with a COVID-vaccine at your convenience   Check the  blood pressure regularly Blood pressure goal:  between 110/65 and  135/85. If it is consistently higher or lower, let me know   Diabetes: You can check your sugars at different times  - early in AM fasting  ( blood sugar goal 70-130) - 2 hours after a meal (blood sugar goal less than 180)   GO TO THE LAB : Get the blood work     Next visit with me in 3 months Please schedule it at the front desk

## 2023-08-31 NOTE — Progress Notes (Signed)
 Subjective:    Patient ID: Cody Fox, male    DOB: [birth year], 63 y.o.   MRN: [id number]  DOS:  08/31/2023 Type of visit - description: Follow-up  Here for diabetes management, feeling well. Ambulatory CBGs reviewed. Occasional nausea after he takes metformin .  No diarrhea.   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    amLODipine  (NORVASC ) 10 mg, Oral, Daily   Blood Glucose Monitoring Suppl (ACCU-CHEK GUIDE ME) w/Device KIT 1 each, Does not apply, 2 times daily, Use meter to check blood sugar 2 times daily.    glucose blood (ACCU-CHEK GUIDE) test strip Use as instructed to check blood sugar 2 times daily.    metFORMIN  (GLUCOPHAGE -XR) 500 MG 24 hr tablet Take 2 tablets (1,000 mg total) by mouth in the morning AND 1 tablet (500 mg total) every evening.   metoprolol  succinate (TOPROL -XL) 100 mg, Oral, Daily, Take with or immediately following a meal   Rybelsus  7 mg, Oral, Daily   spironolactone  (ALDACTONE ) 50 mg, Oral, Daily       Objective:   Physical Exam BP 126/82 (BP Location: Left Arm, Patient Position: Sitting, Cuff Size: Large)   Pulse 75   Ht 5' 9 (1.753 m)   Wt 243 lb 3.2 oz (110.3 kg)   SpO2 98%   BMI 35.91 kg/m  General:   Well developed, NAD, BMI noted. HEENT:  Normocephalic . Face symmetric, atraumatic Lungs:  CTA B Normal respiratory effort, no  intercostal retractions, no accessory muscle use. Heart: RRR,  no murmur.  Lower extremities: no pretibial edema bilaterally  Skin: Not pale. Not jaundice Neurologic:  alert & oriented X3.  Speech normal, gait appropriate for age and unassisted Psych--  Cognition and judgment appear intact.  Cooperative with normal attention span and concentration.  Behavior appropriate. No anxious or depressed appearing.      Assessment   Assessment DM HTN Hypokalemia:Was evaluated by endocrinology, eval for hyperaldosteronism was nondiagnostic with negative urine aldosterone and CAT scans. Macular degeneration 2012 Kidney stones Hearing loss (saw ENT 01/2020)  PLAN: DM: Patient is on metformin , started Rybelsus  7 mg on November 2024. Ambulatory CBGs 2 hours postprandial 120, 110. Overall he feels better, more energetic, decreased appetite.  He is trying to be more active. Occasional nausea.  Recommend to spread his medication to prevent nausea, take Rybelsus  with breakfast, metformin  with lunch and dinner.  Check a A1C.  Based on results, most likely will increase Rybelsus  but pt will be somewhat hesitant to increase meds >> benefits of better DM control d/w pt. HTN: Ambulatory BPs 120/ 70s.  Continue Amlodipine , metoprolol , Aldactone .  Check BMP. Dyslipidemia: Diet controlled for now. Preventive care: Had a flu shot, recommend to consider COVID-vaccine. RTC 3 months.

## 2023-09-01 LAB — BASIC METABOLIC PANEL
BUN: 22 mg/dL (ref 6–23)
CO2: 25 meq/L (ref 19–32)
Calcium: 9.1 mg/dL (ref 8.4–10.5)
Chloride: 97 meq/L (ref 96–112)
Creatinine, Ser: 1.17 mg/dL (ref 0.40–1.50)
GFR: 67.82 mL/min (ref >60.00)
Glucose, Bld: 113 mg/dL — ABNORMAL HIGH (ref 70–99)
Potassium: 5 meq/L (ref 3.5–5.1)
Sodium: 134 meq/L — ABNORMAL LOW (ref 135–145)

## 2023-09-01 LAB — HEMOGLOBIN A1C: Hgb A1c MFr Bld: 7.9 % — ABNORMAL HIGH (ref 4.6–6.5)

## 2023-09-01 NOTE — Assessment & Plan Note (Signed)
 DM: Patient is on metformin , started Rybelsus  7 mg on November 2024. Ambulatory CBGs 2 hours postprandial 120, 110. Overall he feels better, more energetic, decreased appetite.  He is trying to be more active. Occasional nausea.  Recommend to spread his medication to prevent nausea, take Rybelsus  with breakfast, metformin  with lunch and dinner.  Check a A1C.  Based on results, most likely will increase Rybelsus  but pt will be somewhat hesitant to increase meds >> benefits of better DM control d/w pt. HTN: Ambulatory BPs 120/ 70s.  Continue Amlodipine , metoprolol , Aldactone .  Check BMP. Dyslipidemia: Diet controlled for now. Preventive care: Had a flu shot, recommend to consider COVID-vaccine. RTC 3 months.

## 2023-09-05 ENCOUNTER — Telehealth: Payer: Self-pay | Admitting: Internal Medicine

## 2023-09-05 MED ORDER — RYBELSUS 14 MG PO TABS: 14.0000 mg | ORAL_TABLET | Freq: Every day | ORAL | 0 refills | Status: DC

## 2023-09-05 NOTE — Telephone Encounter (Signed)
 PA initiated via Covermymeds; KEY: [id number]. Awaiting determination.

## 2023-09-05 NOTE — Addendum Note (Signed)
 Addended by: Ho Parisi D on: 09/05/2023 10:09 AM   Modules accepted: Orders

## 2023-09-07 NOTE — Telephone Encounter (Signed)
PA approved.

## 2023-09-16 ENCOUNTER — Other Ambulatory Visit: Payer: Self-pay | Admitting: Internal Medicine

## 2023-09-16 ENCOUNTER — Telehealth: Payer: Self-pay | Admitting: Neurology

## 2023-09-16 DIAGNOSIS — E119 Type 2 diabetes mellitus without complications: Secondary | ICD-10-CM

## 2023-09-16 NOTE — Telephone Encounter (Signed)
 Copied from CRM [id number]. Topic: Clinical - Prescription Issue >> Sep 16, 2023  3:20 PM Irine Seal wrote: Reason for CRM: Patient called regarding a refill issue for Semaglutide (Rybelsus) 14 mg tablets. The medication dose was increased from 7 mg to 14 mg on 09/05/23, and [facility] and has not provided an explanation. Patient was advised to contact their insurance. The insurance company reported no holds or issues and sent a letter to the pharmacy, but [facility] still will not fill the prescription or provide a reason. Patient is requesting a call to the pharmacy

## 2023-09-16 NOTE — Telephone Encounter (Addendum)
 [facility]/ [facility]- informed that they are missing a diagnosis code, informed E11.9 for type 2 diabetes. Informed by [facility] that per ins Pt must enroll in copay assistance, then he has to call customer service at his ins.

## 2023-10-07 NOTE — Addendum Note (Signed)
 Addended by: Willow Ora E on: 10/07/2023 08:10 AM   Modules accepted: Orders

## 2023-10-07 NOTE — Telephone Encounter (Signed)
 Let patient know, I put a pharmacy referral to see if there is a  alternative for Rybelsus otherwise we will switch to pioglitazone

## 2023-10-13 ENCOUNTER — Telehealth: Payer: Self-pay

## 2023-10-13 NOTE — Progress Notes (Signed)
 Care Guide Pharmacy Note  10/13/2023 Name: [name] MRN: [id number] DOB: February 09, 1961  Referred By: Wanda Plump, MD Reason for referral: Care Coordination (Outreach to schedule with Pharm d )   Cody Fox is a 63 y.o. year old male who is a primary care patient of Wanda Plump, MD.  [name] was referred to the pharmacist for assistance related to: DMII  Successful contact was made with the patient to discuss pharmacy services including being ready for the pharmacist to call at least 5 minutes before the scheduled appointment time and to have medication bottles and any blood pressure readings ready for review. The patient agreed to meet with the pharmacist via telephone visit on (date/time).10/19/2023  Penne Lash , RMA     [place]  [facility], [facility] Guide  Direct Dial: [phone]  Website: [email or web address]

## 2023-10-19 ENCOUNTER — Ambulatory Visit (INDEPENDENT_AMBULATORY_CARE_PROVIDER_SITE_OTHER): Payer: PRIVATE HEALTH INSURANCE | Admitting: Pharmacist

## 2023-10-19 DIAGNOSIS — E119 Type 2 diabetes mellitus without complications: Secondary | ICD-10-CM

## 2023-10-19 DIAGNOSIS — Z7984 Long term (current) use of oral hypoglycemic drugs: Secondary | ICD-10-CM

## 2023-10-19 NOTE — Progress Notes (Signed)
 10/19/2023 Name: Cody Fox MRN: [id number] DOB: Feb 07, 1961  Chief Complaint  Patient presents with   Diabetes   Medication Management    Cody Fox is a 63 y.o. year old male who presented for a telephone visit.   They were referred to the pharmacist by their PCP for assistance in managing medication access.    Subjective:  Medication Access/Adherence  Current Pharmacy:  [id number], [place] - [address] [place] [address] Phone: [phone] Fax: [phone]  [facility] ORDER [id number] [place], [place] - [address] [place] [address] Phone: [phone] Fax: [phone]   Patient reports affordability concerns with their medications: Yes  Patient reports access/transportation concerns to their pharmacy: No  Patient reports adherence concerns with their medications:  Yes  - not taking Rybelsus due to cost    Diabetes:  Current medications:  metformin ER 500mg  - take 2 tablets with Morning meal and 1 tablet with evening meal.  Rybelsus 14mg  - take 1 tablet daily - has not started new dose yet due to cost of medication. When he tried to fill in March 20[facility] was > [facility].     Objective:  Lab Results  Component Value Date   HGBA1C 7.9 (H) 08/31/2023    Lab Results  Component Value Date   CREATININE 1.17 08/31/2023   BUN 22 08/31/2023   NA 134 (L) 08/31/2023   K 5.0 08/31/2023   CL 97 08/31/2023   CO2 25 08/31/2023    Lab Results  Component Value Date   CHOL 179 12/21/2022   HDL 31.20 (L) 12/21/2022   LDLCALC 89 11/10/2018   LDLDIRECT 82.0 12/21/2022   TRIG (H) 12/21/2022    477.0 Triglyceride is over 400; calculations on Lipids are invalid.   CHOLHDL 6 12/21/2022    Medications Reviewed Today     Reviewed by Henrene Pastor, RPH-CPP (Pharmacist) on 10/19/23 at 1039  Med List Status: <None>   Medication Order Taking? Sig Documenting  Provider Last Dose Status Informant  Accu-Chek FastClix Lancets MISC [id number] Yes Use lancet to check blood sugar twice daily. Wanda Plump, MD Taking Active   amLODipine (NORVASC) 10 MG tablet [id number] Yes Take 1 tablet (10 mg total) by mouth daily. Wanda Plump, MD Taking Active   Blood Glucose Monitoring Suppl (ACCU-CHEK GUIDE ME) w/Device KIT [id number] Yes 1 each by Does not apply route 2 (two) times daily. Use meter to check blood sugar 2 times daily. Reather Littler, MD Taking Active   glucose blood (ACCU-CHEK GUIDE) test strip [id number] Yes Use as instructed to check blood sugar 2 times daily. Wanda Plump, MD Taking Active   metFORMIN (GLUCOPHAGE-XR) 500 MG 24 hr tablet [id number] Yes Take 2 tablets (1,000 mg total) by mouth in the morning AND 1 tablet (500 mg total) every evening. Wanda Plump, MD Taking Active   metoprolol succinate (TOPROL-XL) 100 MG 24 hr tablet [id number] Yes Take 1 tablet (100 mg total) by mouth daily. Take with or immediately following a meal Paz, Nolon Rod, MD Taking Active   Semaglutide (RYBELSUS) 14 MG TABS [id number] No Take 1 tablet (14 mg total) by mouth daily.  Patient not taking: Reported on 10/19/2023   Wanda Plump, MD Not Taking Active   spironolactone (ALDACTONE) 50 MG tablet [id number] Yes Take 1 tablet (50 mg total) by mouth daily. Wanda Plump, MD Taking Active  Assessment/Plan:   Diabetes: Currently uncontrolled - Called his insurance. Per representative Rybelsus 14mg  prior authorization was approved. Cost will be $300 but patient has been advised to use [employer] coapy card and then cost would decreased to around $10 - [facility] and assisted with using [employer] copay card - lowered cost of Rybelsus to 14mg  daily.   Follow Up Plan: as needed - patient will contact Clinical Pharmacist Practitioner is any medication questions or cost issues arise in the future.   Henrene Pastor, PharmD Clinical Pharmacist [facility]  [facility]

## 2023-10-25 ENCOUNTER — Other Ambulatory Visit: Payer: Self-pay | Admitting: Internal Medicine

## 2023-11-22 ENCOUNTER — Other Ambulatory Visit: Payer: Self-pay | Admitting: Internal Medicine

## 2024-01-14 ENCOUNTER — Other Ambulatory Visit: Payer: Self-pay | Admitting: Internal Medicine

## 2024-01-17 ENCOUNTER — Other Ambulatory Visit (HOSPITAL_BASED_OUTPATIENT_CLINIC_OR_DEPARTMENT_OTHER): Payer: Self-pay

## 2024-01-17 MED ORDER — ZOSTER VAC RECOMB ADJUVANTED 50 MCG/0.5ML IM SUSR
0.5000 mL | Freq: Once | INTRAMUSCULAR | 0 refills | Status: AC
  Filled 2024-01-17: qty 0.5, 1d supply, fill #0

## 2024-01-25 ENCOUNTER — Other Ambulatory Visit: Payer: Self-pay | Admitting: Internal Medicine

## 2024-01-29 ENCOUNTER — Other Ambulatory Visit: Payer: Self-pay | Admitting: Family Medicine

## 2024-01-29 ENCOUNTER — Other Ambulatory Visit: Payer: Self-pay | Admitting: Internal Medicine

## 2024-02-02 ENCOUNTER — Other Ambulatory Visit: Payer: Self-pay | Admitting: Family Medicine

## 2024-02-02 MED ORDER — METOPROLOL SUCCINATE ER 100 MG PO TB24: 100.0000 mg | ORAL_TABLET | Freq: Every day | ORAL | 0 refills | Status: DC

## 2024-02-02 MED ORDER — SPIRONOLACTONE 50 MG PO TABS: 50.0000 mg | ORAL_TABLET | Freq: Every day | ORAL | 0 refills | Status: DC

## 2024-02-02 MED ORDER — AMLODIPINE BESYLATE 10 MG PO TABS: 10.0000 mg | ORAL_TABLET | Freq: Every day | ORAL | 0 refills | Status: DC

## 2024-02-02 NOTE — Telephone Encounter (Unsigned)
 Copied from CRM [id number]. Topic: Clinical - Prescription Issue >> Feb 02, 2024 11:04 AM Franky GRADE wrote: Reason for CRM: Patient is calling because he followed up with [facility] regarding his metoprolol  succinate (TOPROL -XL) 100 MG 24 hr tablet [[id number]]. spironolactone  (ALDACTONE ) 50 MG tablet [[id number]] & amLODipine  (NORVASC ) 10 MG tablet [[id number]] prescriptions and was advised they are being prepared he would like to know if we can send a limited supply to his local pharmacy where he can pick them up as he is completely out. [id number] [place], [place] - [address]  Phone: [phone] Fax: [phone]

## 2024-02-02 NOTE — Telephone Encounter (Signed)
Short term supply sent.

## 2024-02-24 ENCOUNTER — Other Ambulatory Visit: Payer: Self-pay | Admitting: Internal Medicine

## 2024-02-28 ENCOUNTER — Other Ambulatory Visit: Payer: Self-pay | Admitting: Internal Medicine

## 2024-02-28 NOTE — Telephone Encounter (Signed)
 Metformin  Rx denied. Pt overdue for appt. He was informed via letter on 09/16/23 that his next appt should be in May 2025. Rx sent on 01/16/24 for 30 day supply also mentioned needing appt, AND Jade sent a mychart message to Pt on 02/24/24 informing he is overdue for appt.

## 2024-02-29 ENCOUNTER — Other Ambulatory Visit: Payer: Self-pay | Admitting: Internal Medicine

## 2024-02-29 NOTE — Telephone Encounter (Signed)
 Rybelsus  rx denied. Pt overdue for appt. He was informed via letter on 09/16/23 that his next appt should be in May 2025. Rx sent on 01/16/24 for 30 day supply also mentioned needing appt, AND Jade sent a mychart message to Pt on 02/24/24 informing he is overdue for appt.

## 2024-03-12 ENCOUNTER — Other Ambulatory Visit: Payer: Self-pay | Admitting: Internal Medicine

## 2024-03-12 MED ORDER — AMLODIPINE BESYLATE 10 MG PO TABS: 10.0000 mg | ORAL_TABLET | Freq: Every day | ORAL | 0 refills | Status: DC

## 2024-03-12 MED ORDER — METOPROLOL SUCCINATE ER 100 MG PO TB24: 100.0000 mg | ORAL_TABLET | Freq: Every day | ORAL | 0 refills | Status: DC

## 2024-03-12 MED ORDER — METFORMIN HCL ER 500 MG PO TB24: ORAL_TABLET | ORAL | 0 refills | Status: DC

## 2024-03-12 MED ORDER — RYBELSUS 14 MG PO TABS: 14.0000 mg | ORAL_TABLET | Freq: Every day | ORAL | 0 refills | Status: DC

## 2024-03-12 MED ORDER — SPIRONOLACTONE 50 MG PO TABS: 50.0000 mg | ORAL_TABLET | Freq: Every day | ORAL | 0 refills | Status: DC

## 2024-03-12 NOTE — Telephone Encounter (Signed)
 Copied from CRM #[id number]. Topic: Clinical - Medication Refill >> Mar 12, 2024  9:05 AM Kevelyn M wrote: Medication: metFORMIN  (GLUCOPHAGE -XR) 500 MG 24 hr tablet,amLODipine  (NORVASC ) 10 MG tablet, metoprolol  succinate (TOPROL -XL) 100 MG 24 hr tablet, Semaglutide  (RYBELSUS ) 14 MG TABS, spironolactone  (ALDACTONE ) 50 MG tablet. Patient is asking for 90 days.  Has the patient contacted their pharmacy? Yes (Agent: If no, request that the patient contact the pharmacy for the refill. If patient does not wish to contact the pharmacy document the reason why and proceed with request.) (Agent: If yes, when and what did the pharmacy advise?)  This is the patient's preferred pharmacy:  [id number] [place] [facility], [place] - [address] [place] [address] Phone: [phone] Fax: [phone]  Is this the correct pharmacy for this prescription? Yes If no, delete pharmacy and type the correct one.   Has the prescription been filled recently? No  Is the patient out of the medication? No, running low on all meds  Has the patient been seen for an appointment in the last year OR does the patient have an upcoming appointment? Yes  Can we respond through MyChart? Yes  Agent: Please be advised that Rx refills may take up to 3 business days. We ask that you follow-up with your pharmacy.

## 2024-04-03 ENCOUNTER — Other Ambulatory Visit (HOSPITAL_BASED_OUTPATIENT_CLINIC_OR_DEPARTMENT_OTHER): Payer: Self-pay

## 2024-04-03 MED ORDER — SHINGRIX 50 MCG/0.5ML IM SUSR
0.5000 mL | Freq: Once | INTRAMUSCULAR | 0 refills | Status: AC
  Filled 2024-04-03: qty 0.5, 1d supply, fill #0

## 2024-04-08 ENCOUNTER — Other Ambulatory Visit: Payer: Self-pay | Admitting: Internal Medicine

## 2024-04-12 ENCOUNTER — Other Ambulatory Visit: Payer: Self-pay | Admitting: Internal Medicine

## 2024-04-17 ENCOUNTER — Ambulatory Visit: Payer: PRIVATE HEALTH INSURANCE | Admitting: Internal Medicine

## 2024-04-17 ENCOUNTER — Encounter: Payer: Self-pay | Admitting: Internal Medicine

## 2024-04-17 VITALS — BP 126/66 | HR 70 | Temp 97.8°F | Resp 16 | Ht 69.0 in | Wt 247.0 lb

## 2024-04-17 DIAGNOSIS — Z7984 Long term (current) use of oral hypoglycemic drugs: Secondary | ICD-10-CM | POA: Diagnosis not present

## 2024-04-17 DIAGNOSIS — E785 Hyperlipidemia, unspecified: Secondary | ICD-10-CM

## 2024-04-17 DIAGNOSIS — E119 Type 2 diabetes mellitus without complications: Secondary | ICD-10-CM

## 2024-04-17 DIAGNOSIS — Z6836 Body mass index (BMI) 36.0-36.9, adult: Secondary | ICD-10-CM

## 2024-04-17 DIAGNOSIS — Z23 Encounter for immunization: Secondary | ICD-10-CM | POA: Diagnosis not present

## 2024-04-17 DIAGNOSIS — I10 Essential (primary) hypertension: Secondary | ICD-10-CM

## 2024-04-17 NOTE — Patient Instructions (Addendum)
 You got a flu shot today. Recommend a COVID booster this fall  Diabetes: You can check your sugars at different times  - early in AM fasting  ( blood sugar goal 70-130) - 2 hours after a meal (blood sugar goal less than 180)    Continue checking your blood pressures Blood pressure goal:  between 110/65 and  135/85. If it is consistently higher or lower, let me know     GO TO THE LAB :  Get the blood work   Your results will be posted on MyChart with my comments  Go to the front desk for the checkout Please make an appointment  for a physical exam in 3 months

## 2024-04-17 NOTE — Progress Notes (Unsigned)
 Subjective:    Patient ID: Cody Fox, male    DOB: [birth year], 63 y.o.   MRN: [id number]  DOS:  04/17/2024 Type of visit - description: Follow-up  Chronic medical problems addressed. Reports good med compliance. Denies concerns. Denies feet numbness.   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    amLODipine  (NORVASC ) 10 mg, Oral, Daily, Needs appt   Blood Glucose Monitoring Suppl (ACCU-CHEK GUIDE ME) w/Device KIT 1 each, Does not apply, 2 times daily, Use meter to check blood sugar 2 times daily.    glucose blood (ACCU-CHEK GUIDE) test strip Use as instructed to check blood sugar 2 times daily.    metFORMIN  (GLUCOPHAGE -XR) 500 MG 24 hr tablet TAKE 2 TABLETS BY MOUTH IN THE MORNING AND 1 TABLET EVERY EVENING. NEEDS APPT.   metoprolol  succinate (TOPROL -XL) 100 mg, Oral, Daily, Take with or immediately following a meal. Needs appt   RYBELSUS  14 MG TABS TAKE 1 TABLET BY MOUTH DAILY. NEEDS APPT   spironolactone  (ALDACTONE ) 50 MG tablet TAKE 1 TABLET BY MOUTH DAILY. NEEDS APPT       Objective:   Physical Exam BP 126/66   Pulse 70   Temp 97.8 F (36.6 C) (Oral)   Resp 16   Ht 5' 9 (1.753 m)   Wt 247 lb (112 kg)   SpO2 97%   BMI 36.48 kg/m  General:   Well developed, NAD, BMI noted. HEENT:  Normocephalic . Face symmetric, atraumatic Lungs:  CTA B Normal respiratory effort, no intercostal retractions, no accessory muscle use. Heart: RRR,  no murmur.  DM foot exam: Normal pinprick examination.  Good pedal  pulses Skin: Not pale. Not jaundice Neurologic:  alert & oriented X3.  Speech normal, gait appropriate for age and unassisted Psych--  Cognition and judgment appear intact.  Cooperative with normal attention span and concentration.  Behavior appropriate. No anxious or depressed appearing.      Assessment     Assessment DM HTN Dyslipidemia  Hypokalemia:Was evaluated by endocrinology, eval for hyperaldosteronism was nondiagnostic with negative urine aldosterone and CAT scans. Morbid obesity: BMI 35, DM, HTN Macular degeneration 2012 Kidney stones Hearing loss (saw ENT 01/2020)  PLAN: DM: On Rybelsus  14 mg daily, metformin , no recent ambulatory CBGs, feet exam negative. Started Rybelsus  last year, no weight loss. Advised patient this is the best opportunity he has to do better with diet and lose some weight.  Offered to change to injectable semaglutide , declined. Checking A1c and micro. Morbid obesity: See above comments, on November 2024 started Rybelsus , his weight was 243 pounds, BMI 35.9.  Not making much progress. Dyslipidemia: Currently diet controlled, check FLP.  Has been very resistant to take any medication.  Advised he is at a increased risk of CAD and strokes d/t DM and statins are clearly indicated.  States he understands but he remains somewhat reluctant to the idea.  Further advised for results. HTN, h/o hypokalemia: Reports normal ambulatory BPs.  Continue metoprolol , amlodipine , Aldactone .  Checking CMP, CBC. Preventive care: Flu shot today, recommend a COVID booster. RTC 3 months CPX

## 2024-04-18 LAB — CBC WITH DIFFERENTIAL/PLATELET
Basophils Absolute: 0.1 K/uL (ref 0.0–0.1)
Basophils Relative: 0.8 % (ref 0.0–3.0)
Eosinophils Absolute: 0.1 K/uL (ref 0.0–0.7)
Eosinophils Relative: 1.5 % (ref 0.0–5.0)
HCT: 38.7 % — ABNORMAL LOW (ref 39.0–52.0)
Hemoglobin: 13.3 g/dL (ref 13.0–17.0)
Lymphocytes Relative: 19.8 % (ref 12.0–46.0)
Lymphs Abs: 1.8 K/uL (ref 0.7–4.0)
MCHC: 34.5 g/dL (ref 30.0–36.0)
MCV: 87.7 fl (ref 78.0–100.0)
Monocytes Absolute: 0.8 K/uL (ref 0.1–1.0)
Monocytes Relative: 8.4 % (ref 3.0–12.0)
Neutro Abs: 6.4 K/uL (ref 1.4–7.7)
Neutrophils Relative %: 69.5 % (ref 43.0–77.0)
Platelets: 345 K/uL (ref 150.0–400.0)
RBC: 4.41 Mil/uL (ref 4.22–5.81)
RDW: 13.9 % (ref 11.5–15.5)
WBC: 9.2 K/uL (ref 4.0–10.5)

## 2024-04-18 LAB — COMPREHENSIVE METABOLIC PANEL WITH GFR
ALT: 44 U/L (ref 0–53)
AST: 37 U/L (ref 0–37)
Albumin: 4.4 g/dL (ref 3.5–5.2)
Alkaline Phosphatase: 51 U/L (ref 39–117)
BUN: 20 mg/dL (ref 6–23)
CO2: 26 meq/L (ref 19–32)
Calcium: 9.2 mg/dL (ref 8.4–10.5)
Chloride: 100 meq/L (ref 96–112)
Creatinine, Ser: 1.12 mg/dL (ref 0.40–1.50)
GFR: 71.15 mL/min (ref >60.00)
Glucose, Bld: 127 mg/dL — ABNORMAL HIGH (ref 70–99)
Potassium: 4.4 meq/L (ref 3.5–5.1)
Sodium: 136 meq/L (ref 135–145)
Total Bilirubin: 0.4 mg/dL (ref 0.2–1.2)
Total Protein: 6.4 g/dL (ref 6.0–8.3)

## 2024-04-18 LAB — LIPID PANEL
Cholesterol: 156 mg/dL (ref 0–200)
HDL: 30.2 mg/dL — ABNORMAL LOW (ref >39.00)
NonHDL: 126.17
Total CHOL/HDL Ratio: 5
Triglycerides: 496 mg/dL — ABNORMAL HIGH (ref 0.0–149.0)
VLDL: 99.2 mg/dL — ABNORMAL HIGH (ref 0.0–40.0)

## 2024-04-18 LAB — MICROALBUMIN / CREATININE URINE RATIO
Creatinine,U: 215.8 mg/dL
Microalb Creat Ratio: 6 mg/g (ref 0.0–30.0)
Microalb, Ur: 1.3 mg/dL (ref 0.0–1.9)

## 2024-04-18 LAB — LDL CHOLESTEROL, DIRECT: Direct LDL: 60 mg/dL

## 2024-04-18 LAB — HEMOGLOBIN A1C: Hgb A1c MFr Bld: 8.8 % — ABNORMAL HIGH (ref 4.6–6.5)

## 2024-04-18 NOTE — Assessment & Plan Note (Signed)
 DM: On Rybelsus  14 mg daily, metformin , no recent ambulatory CBGs, feet exam negative. Started Rybelsus  last year, no weight loss. Advised patient this is the best opportunity he has to do better with diet and lose some weight.  Offered to change to injectable semaglutide , declined. Checking A1c and micro. Morbid obesity: See above comments, on November 2024 started Rybelsus , his weight was 243 pounds, BMI 35.9.  Not making much progress. Dyslipidemia: Currently diet controlled, check FLP.  Has been very resistant to take any medication.  Advised he is at a increased risk of CAD and strokes d/t DM and statins are clearly indicated.  States he understands but he remains somewhat reluctant to the idea.  Further advised for results. HTN, h/o hypokalemia: Reports normal ambulatory BPs.  Continue metoprolol , amlodipine , Aldactone .  Checking CMP, CBC. Preventive care: Flu shot today, recommend a COVID booster. RTC 3 months CPX

## 2024-04-19 ENCOUNTER — Ambulatory Visit: Payer: Self-pay | Admitting: Internal Medicine

## 2024-04-19 DIAGNOSIS — E1165 Type 2 diabetes mellitus with hyperglycemia: Secondary | ICD-10-CM

## 2024-04-20 ENCOUNTER — Other Ambulatory Visit: Payer: Self-pay | Admitting: Internal Medicine

## 2024-04-20 ENCOUNTER — Telehealth: Payer: Self-pay

## 2024-04-20 ENCOUNTER — Other Ambulatory Visit (HOSPITAL_COMMUNITY): Payer: Self-pay

## 2024-04-20 MED ORDER — METFORMIN HCL ER 500 MG PO TB24: ORAL_TABLET | ORAL | 0 refills | Status: DC

## 2024-04-20 MED ORDER — RYBELSUS 14 MG PO TABS: 1.0000 | ORAL_TABLET | Freq: Every day | ORAL | 0 refills | Status: DC

## 2024-04-20 MED ORDER — FENOFIBRATE MICRONIZED 130 MG PO CAPS: 130.0000 mg | ORAL_CAPSULE | Freq: Every day | ORAL | 0 refills | Status: DC

## 2024-04-20 NOTE — Telephone Encounter (Signed)
 Pharmacy Patient Advocate Encounter   Received notification from RX Request Messages that prior authorization for Fenofibrate  130mg  caps (micronized) is required/requested.   Insurance verification completed.   The patient is insured through [employer] .   Per test claim: Product/Service not covered - Plan/Benefit exclusion  Tried a test claim for the Fenofibrate  145mg  tablet (generic Tricor ) and the copay for a 30 days supply is $8.57.  No PA is required for the generic of Tricor 

## 2024-04-23 MED ORDER — FENOFIBRATE 145 MG PO TABS: 145.0000 mg | ORAL_TABLET | Freq: Every day | ORAL | 0 refills | Status: DC

## 2024-04-23 NOTE — Telephone Encounter (Signed)
 Okay to proceed with fenofibrate  145 mg (generic Tricor ) 1 tablet daily

## 2024-04-23 NOTE — Telephone Encounter (Signed)
 Rx sent.

## 2024-05-02 ENCOUNTER — Other Ambulatory Visit: Payer: Self-pay | Admitting: Internal Medicine

## 2024-05-30 LAB — HM DIABETES EYE EXAM

## 2024-05-31 ENCOUNTER — Encounter: Payer: Self-pay | Admitting: Internal Medicine

## 2024-08-04 ENCOUNTER — Other Ambulatory Visit: Payer: Self-pay | Admitting: Internal Medicine

## 2024-08-06 MED ORDER — FENOFIBRATE 145 MG PO TABS: 145.0000 mg | ORAL_TABLET | Freq: Every day | ORAL | 1 refills | Status: AC

## 2024-08-07 ENCOUNTER — Encounter: Payer: PRIVATE HEALTH INSURANCE | Admitting: Internal Medicine

## 2024-08-15 ENCOUNTER — Other Ambulatory Visit: Payer: Self-pay | Admitting: Internal Medicine

## 2024-10-30 ENCOUNTER — Encounter: Payer: PRIVATE HEALTH INSURANCE | Admitting: Internal Medicine
